# Patient Record
Sex: Female | Born: 1980 | Race: Black or African American | Hispanic: No | Marital: Single | Smoking: Current every day smoker
Health system: Southern US, Community
[De-identification: ages and names within clinical notes are randomized; demographics above are authoritative.]

## PROBLEM LIST (undated history)

## (undated) DIAGNOSIS — T7840XA Allergy, unspecified, initial encounter: Secondary | ICD-10-CM

## (undated) DIAGNOSIS — F419 Anxiety disorder, unspecified: Secondary | ICD-10-CM

## (undated) DIAGNOSIS — I10 Essential (primary) hypertension: Secondary | ICD-10-CM

## (undated) DIAGNOSIS — E119 Type 2 diabetes mellitus without complications: Secondary | ICD-10-CM

## (undated) HISTORY — DX: Anxiety disorder, unspecified: F41.9

## (undated) HISTORY — DX: Essential (primary) hypertension: I10

## (undated) HISTORY — DX: Allergy, unspecified, initial encounter: T78.40XA

## (undated) HISTORY — PX: CHOLECYSTECTOMY: SHX55

## (undated) HISTORY — DX: Type 2 diabetes mellitus without complications: E11.9

## (undated) HISTORY — PX: DENTAL SURGERY: SHX609

## (undated) NOTE — *Deleted (*Deleted)
Diabetes Mellitus and Nutrition, Adult When you have diabetes (diabetes mellitus), it is very important to have healthy eating habits because your blood sugar (glucose) levels are greatly affected by what you eat and drink. Eating healthy foods in the appropriate amounts, at about the same times every day, can help you:  Control your blood glucose.  Lower your risk of heart disease.  Improve your blood pressure.  Reach or maintain a healthy weight. Every person with diabetes is different, and each person has different needs for a meal plan. Your health care provider may recommend that you work with a diet and nutrition specialist (dietitian) to make a meal plan that is best for you. Your meal plan may vary depending on factors such as:  The calories you need.  The medicines you take.  Your weight.  Your blood glucose, blood pressure, and cholesterol levels.  Your activity level.  Other health conditions you have, such as heart or kidney disease. How do carbohydrates affect me? Carbohydrates, also called carbs, affect your blood glucose level more than any other type of food. Eating carbs naturally raises the amount of glucose in your blood. Carb counting is a method for keeping track of how many carbs you eat. Counting carbs is important to keep your blood glucose at a healthy level, especially if you use insulin or take certain oral diabetes medicines. It is important to know how many carbs you can safely have in each meal. This is different for every person. Your dietitian can help you calculate how many carbs you should have at each meal and for each snack. Foods that contain carbs include:  Bread, cereal, rice, pasta, and crackers.  Potatoes and corn.  Peas, beans, and lentils.  Milk and yogurt.  Fruit and juice.  Desserts, such as cakes, cookies, ice cream, and candy. How does alcohol affect me? Alcohol can cause a sudden decrease in blood glucose (hypoglycemia),  especially if you use insulin or take certain oral diabetes medicines. Hypoglycemia can be a life-threatening condition. Symptoms of hypoglycemia (sleepiness, dizziness, and confusion) are similar to symptoms of having too much alcohol. If your health care provider says that alcohol is safe for you, follow these guidelines:  Limit alcohol intake to no more than 1 drink per day for nonpregnant women and 2 drinks per day for men. One drink equals 12 oz of beer, 5 oz of wine, or 1 oz of hard liquor.  Do not drink on an empty stomach.  Keep yourself hydrated with water, diet soda, or unsweetened iced tea.  Keep in mind that regular soda, juice, and other mixers may contain a lot of sugar and must be counted as carbs. What are tips for following this plan?  Reading food labels  Start by checking the serving size on the "Nutrition Facts" label of packaged foods and drinks. The amount of calories, carbs, fats, and other nutrients listed on the label is based on one serving of the item. Many items contain more than one serving per package.  Check the total grams (g) of carbs in one serving. You can calculate the number of servings of carbs in one serving by dividing the total carbs by 15. For example, if a food has 30 g of total carbs, it would be equal to 2 servings of carbs.  Check the number of grams (g) of saturated and trans fats in one serving. Choose foods that have low or no amount of these fats.  Check the number of   milligrams (mg) of salt (sodium) in one serving. Most people should limit total sodium intake to less than 2,300 mg per day.  Always check the nutrition information of foods labeled as "low-fat" or "nonfat". These foods may be higher in added sugar or refined carbs and should be avoided.  Talk to your dietitian to identify your daily goals for nutrients listed on the label. Shopping  Avoid buying canned, premade, or processed foods. These foods tend to be high in fat, sodium,  and added sugar.  Shop around the outside edge of the grocery store. This includes fresh fruits and vegetables, bulk grains, fresh meats, and fresh dairy. Cooking  Use low-heat cooking methods, such as baking, instead of high-heat cooking methods like deep frying.  Cook using healthy oils, such as olive, canola, or sunflower oil.  Avoid cooking with butter, cream, or high-fat meats. Meal planning  Eat meals and snacks regularly, preferably at the same times every day. Avoid going long periods of time without eating.  Eat foods high in fiber, such as fresh fruits, vegetables, beans, and whole grains. Talk to your dietitian about how many servings of carbs you can eat at each meal.  Eat 4-6 ounces (oz) of lean protein each day, such as lean meat, chicken, fish, eggs, or tofu. One oz of lean protein is equal to: ? 1 oz of meat, chicken, or fish. ? 1 egg. ?  cup of tofu.  Eat some foods each day that contain healthy fats, such as avocado, nuts, seeds, and fish. Lifestyle  Check your blood glucose regularly.  Exercise regularly as told by your health care provider. This may include: ? 150 minutes of moderate-intensity or vigorous-intensity exercise each week. This could be brisk walking, biking, or water aerobics. ? Stretching and doing strength exercises, such as yoga or weightlifting, at least 2 times a week.  Take medicines as told by your health care provider.  Do not use any products that contain nicotine or tobacco, such as cigarettes and e-cigarettes. If you need help quitting, ask your health care provider.  Work with a counselor or diabetes educator to identify strategies to manage stress and any emotional and social challenges. Questions to ask a health care provider  Do I need to meet with a diabetes educator?  Do I need to meet with a dietitian?  What number can I call if I have questions?  When are the best times to check my blood glucose? Where to find more  information:  American Diabetes Association: diabetes.org  Academy of Nutrition and Dietetics: www.eatright.org  National Institute of Diabetes and Digestive and Kidney Diseases (NIH): www.niddk.nih.gov Summary  A healthy meal plan will help you control your blood glucose and maintain a healthy lifestyle.  Working with a diet and nutrition specialist (dietitian) can help you make a meal plan that is best for you.  Keep in mind that carbohydrates (carbs) and alcohol have immediate effects on your blood glucose levels. It is important to count carbs and to use alcohol carefully. This information is not intended to replace advice given to you by your health care provider. Make sure you discuss any questions you have with your health care provider. Document Revised: 02/27/2017 Document Reviewed: 04/21/2016 Elsevier Patient Education  2020 Elsevier Inc.  

---

## 2000-02-03 ENCOUNTER — Other Ambulatory Visit: Admission: RE | Admit: 2000-02-03 | Discharge: 2000-02-03 | Payer: Self-pay | Admitting: Family Medicine

## 2003-06-13 ENCOUNTER — Other Ambulatory Visit: Admission: RE | Admit: 2003-06-13 | Discharge: 2003-06-13 | Payer: Self-pay | Admitting: Family Medicine

## 2006-11-07 ENCOUNTER — Ambulatory Visit (HOSPITAL_COMMUNITY): Admission: RE | Admit: 2006-11-07 | Discharge: 2006-11-07 | Payer: Self-pay | Admitting: Orthopedic Surgery

## 2006-11-12 ENCOUNTER — Encounter: Admission: RE | Admit: 2006-11-12 | Discharge: 2007-02-10 | Payer: Self-pay | Admitting: Orthopedic Surgery

## 2008-01-04 ENCOUNTER — Emergency Department (HOSPITAL_COMMUNITY): Admission: EM | Admit: 2008-01-04 | Discharge: 2008-01-04 | Payer: Self-pay | Admitting: Emergency Medicine

## 2009-02-07 IMAGING — CR DG CHEST 2V
2 series · 2 of 2 positions shown · non-contrast
Comparison: 11/05/2006

CLINICAL DATA: Chest pain, headache

CHEST - 2 VIEW

[w chest pa]
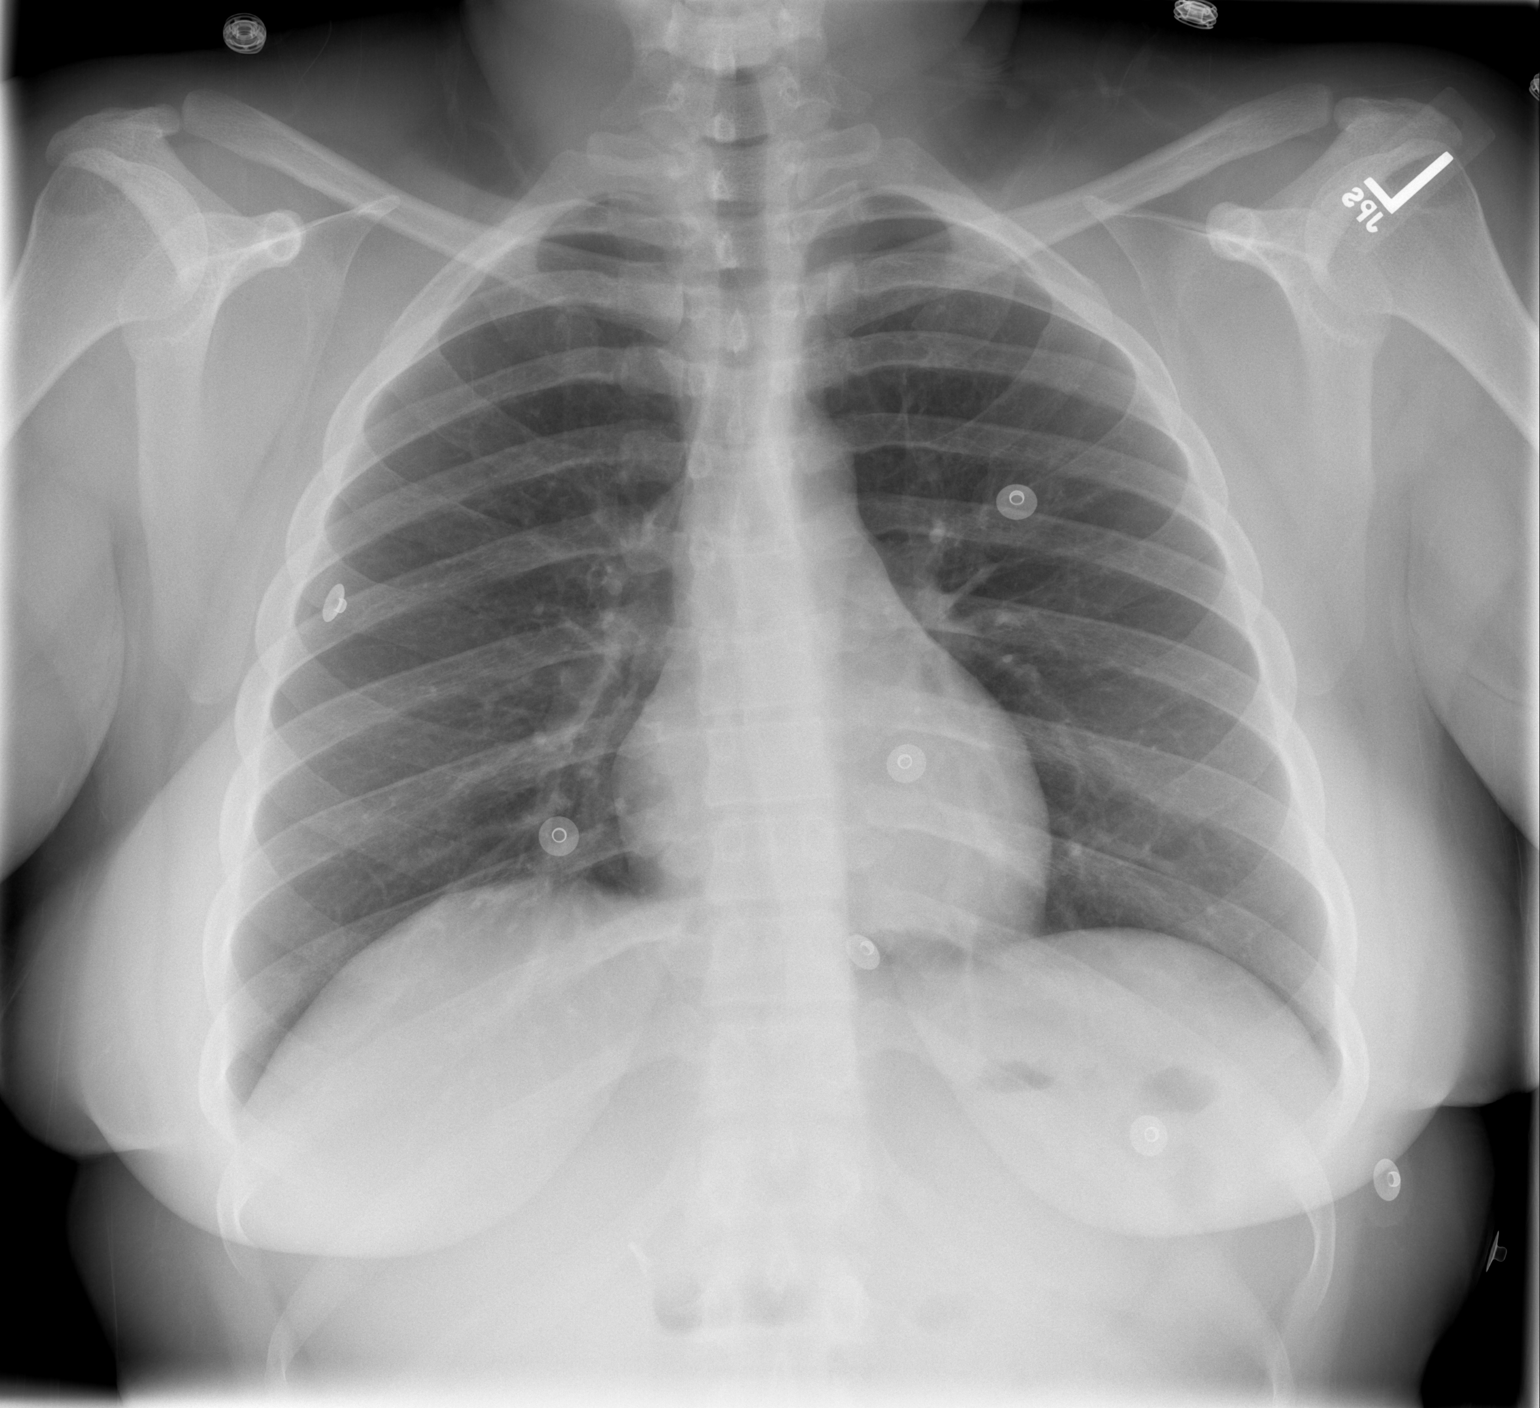

[w chest lat]
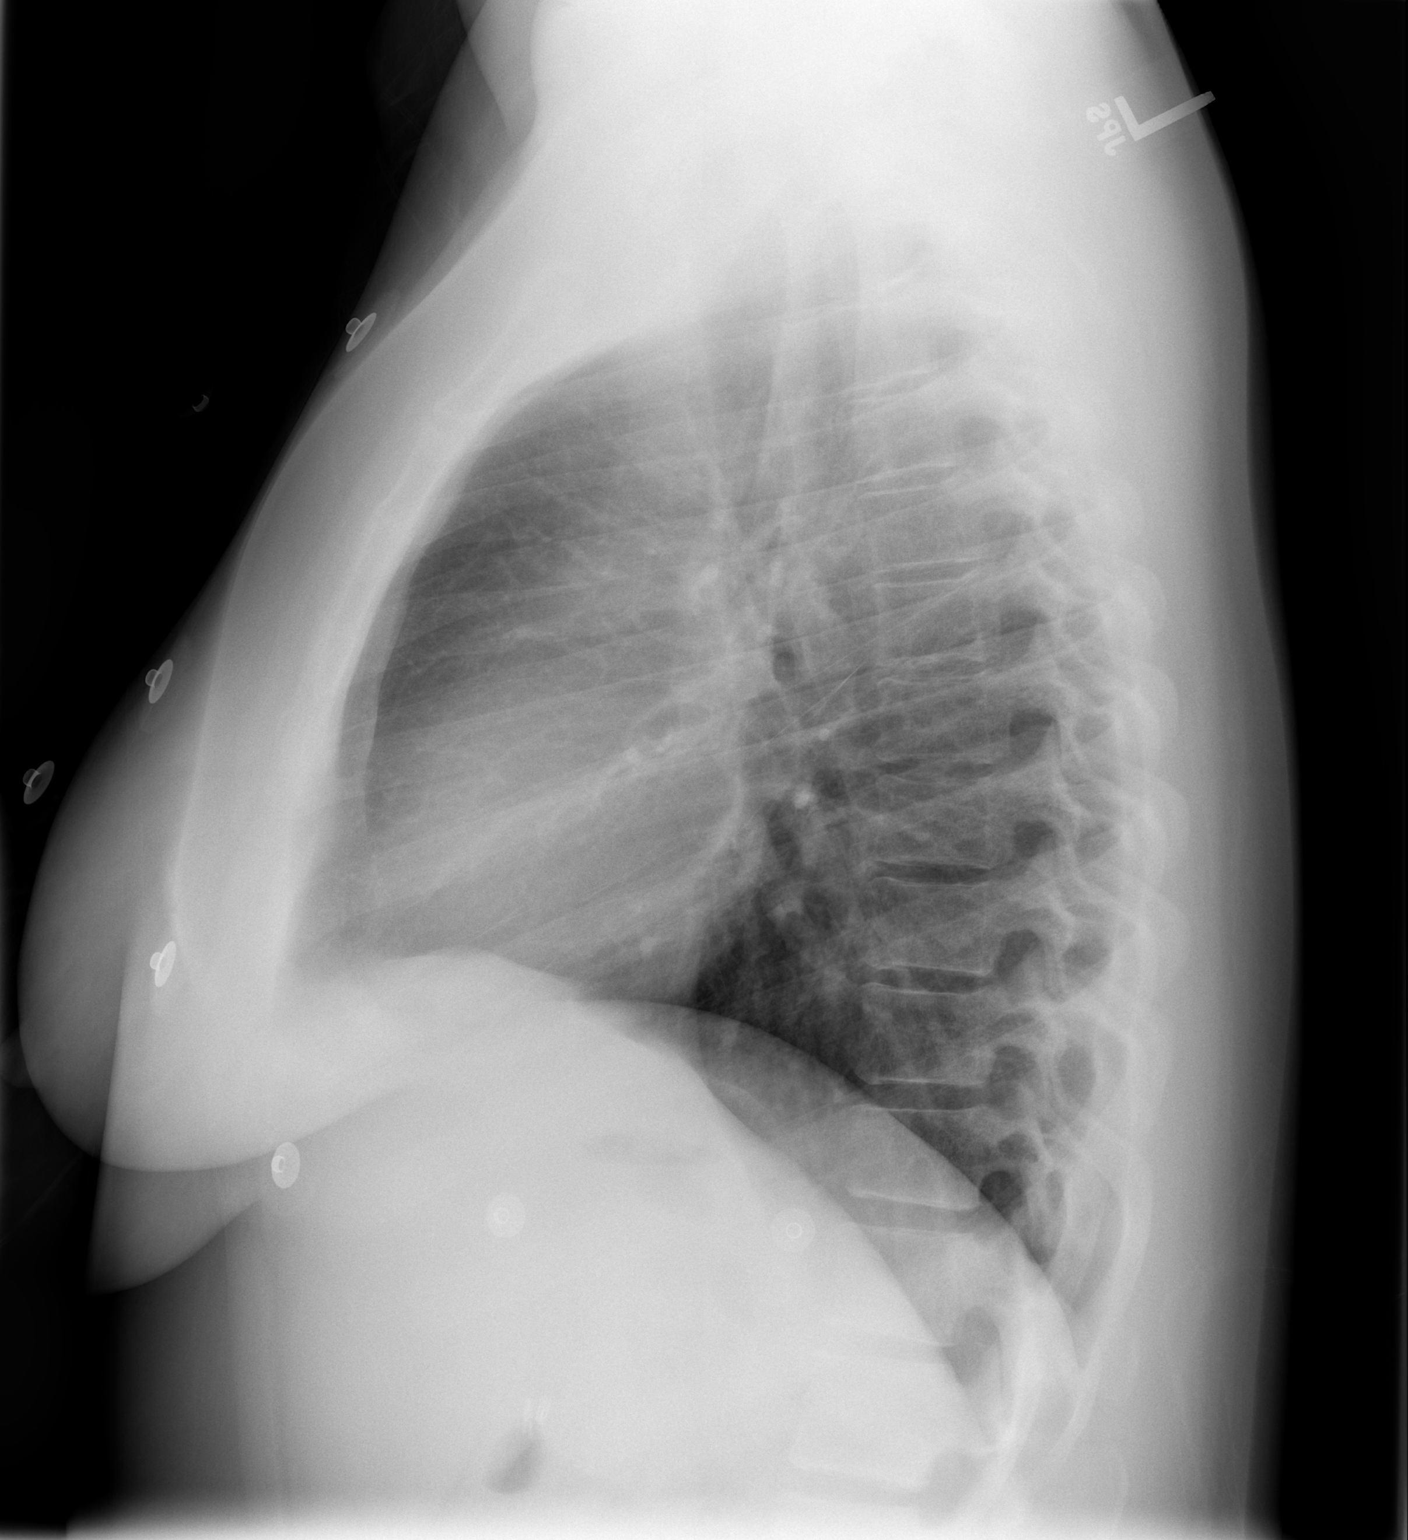

[2 of 2 positions shown; findings below may reference images not displayed]

FINDINGS: The cardiomediastinal silhouette is stable.  No acute
infiltrate or pleural effusion.  No pulmonary edema.  The bony
thorax is stable.
IMPRESSION: No active disease.

## 2010-08-13 NOTE — Op Note (Signed)
[name], Dawn Benjamin          ACCOUNT NO.:  [id number]   MEDICAL RECORD NO.:  [id number]          PATIENT TYPE:  REC   LOCATION:  [facility]                         FACILITY:  [facility]   PHYSICIAN:  Artist Pais. Weingold, M.D.DATE OF BIRTH:  1980/06/01   DATE OF PROCEDURE:  11/07/2006  DATE OF DISCHARGE:                               OPERATIVE REPORT   PREOPERATIVE DIAGNOSIS:  Right fourth and fifth volar laceration.   POSTOPERATIVE DIAGNOSIS:  Right fourth and fifth volar laceration.   PROCEDURE:  1. Explore and repair of right fourth and fifth volar lacerations,      including superficialis and fundus repairs.  2. Zone 2 right fourth and fifth digits.  3. Microscopic repair of right fifth digit ulnar digital nerve.   SURGEON:  Artist Pais. Mina Marble, M.D.   ASSISTANT:  None.   ANESTHESIA:  General.   TOURNIQUET TIME:  1 hour 30 minutes.   COMPLICATIONS:  None.   DRAINS:  None.   OPERATIVE REPORT:  The patient was taken to the operating suite.  After  the induction of adequate general anesthesia, the right upper extremity  was prepped and draped in sterile fashion.  An Esmarch was used to  exsanguinate the limb.  The tourniquet was then inflated to 250 mm of  Mercurty at this point and time.  The patient's lacerations, which had  been sewn shut at the [facility] emergency room, were opened.  They were transverse incisions over the proximal phalangeal area.  They  were opened in mid-lateral fashion proximally and distally.  Visualization revealed complete lacerations of the profundus tendon in  the long finger zone 2,  as well as the ulnar digital nerve.  The ring  finger had a complete laceration of the profundus tendon, and one foot  to the superficialis tendon.  The little finger was approached first  surgically, including repair of profundus and superficialis tendons.  The superficialis tendon slips were repaired with 4-0 Ethibond and  horizontal mattress sutures for  each slip, followed by 6-0 Prolene  epitendinus stitch.  The profundus tendon was repaired with the 3-0  double-armed Ethibond, and a Tajami suture with a 6-0 Prolene locked  epitendinus suture to follow.  After this was done on the little finger,  the microscope was brought into the field and the ulnar digital nerve  was repaired with 9-0 nylon.  The ring finger and the profundus tendon  was repaired using the same method, as well as one slip for the  superficialis  tendon.  At the end of the procedure the wounds were irrigated and  loosely closed with 4-0 nylon.  Xerofoam, 4x4s, fluffs and a dorsal  extension block splint were applied.  The patient tolerated the  procedures well and went to the recovery room in satisfactory condition.      Artist Pais Mina Marble, M.D.  Electronically Signed     MAW/MEDQ  D:  11/07/2006  T:  11/08/2006  Job:  [id number]

## 2010-12-30 LAB — CBC
HCT: 46.1 — ABNORMAL HIGH
Hemoglobin: 15.9 — ABNORMAL HIGH
MCV: 84.5
RBC: 5.46 — ABNORMAL HIGH
WBC: 15.3 — ABNORMAL HIGH

## 2010-12-30 LAB — BASIC METABOLIC PANEL
BUN: 9
CO2: 23
Calcium: 9.9
GFR calc non Af Amer: >60
Glucose, Bld: 280 — ABNORMAL HIGH
Sodium: 136

## 2010-12-30 LAB — DIFFERENTIAL
Basophils Absolute: 0.1
Basophils Relative: 1
Monocytes Absolute: 0.8
Monocytes Relative: 5
Neutro Abs: 9.5 — ABNORMAL HIGH
Neutrophils Relative %: 62

## 2011-01-13 LAB — BASIC METABOLIC PANEL
BUN: 9
CO2: 25
Calcium: 9.7
Chloride: 104
Creatinine, Ser: 0.68
GFR calc Af Amer: >60
GFR calc non Af Amer: >60
Glucose, Bld: 241 — ABNORMAL HIGH
Potassium: 3.5
Sodium: 138

## 2011-01-13 LAB — HCG, SERUM, QUALITATIVE: Preg, Serum: NEGATIVE

## 2011-01-13 LAB — CBC
HCT: 42.8
Hemoglobin: 14.7
MCHC: 34.3
MCV: 86.5
Platelets: 369
RBC: 4.95
RDW: 12.3
WBC: 10.9 — ABNORMAL HIGH

## 2013-12-13 ENCOUNTER — Telehealth: Payer: Self-pay | Admitting: Family Medicine

## 2013-12-22 NOTE — Telephone Encounter (Signed)
Several attempts made to patient and no answer

## 2013-12-27 ENCOUNTER — Telehealth: Payer: Self-pay | Admitting: Family Medicine

## 2013-12-27 NOTE — Telephone Encounter (Signed)
Appt given per patient request 

## 2013-12-30 ENCOUNTER — Ambulatory Visit (INDEPENDENT_AMBULATORY_CARE_PROVIDER_SITE_OTHER): Payer: PRIVATE HEALTH INSURANCE | Admitting: Family

## 2013-12-30 ENCOUNTER — Encounter (INDEPENDENT_AMBULATORY_CARE_PROVIDER_SITE_OTHER): Payer: Self-pay

## 2013-12-30 ENCOUNTER — Encounter: Payer: Self-pay | Admitting: Family

## 2013-12-30 VITALS — BP 136/91 | HR 105 | Temp 99.5°F | Wt 155.2 lb

## 2013-12-30 DIAGNOSIS — L0291 Cutaneous abscess, unspecified: Secondary | ICD-10-CM

## 2013-12-30 MED ORDER — CEPHALEXIN 500 MG PO CAPS: 500.0000 mg | ORAL_CAPSULE | Freq: Four times a day (QID) | ORAL | Status: DC

## 2013-12-30 NOTE — Progress Notes (Signed)
   Subjective:    Patient ID: Dawn Benjamin, female    DOB: 05/05/1980, 33 y.o.   MRN: [id number]  HPI Pt presents to the office for several boils on upper thighs and labia. Pt states the one on her labia is large and painful, and the 3-4 that were on her thigh "busted" a few days ago. Pt states it started about one week ago and has used warm compresses which helped. Pt states she she has had these in the past that flare up over the last couple of years.    Review of Systems  Constitutional: Negative.   HENT: Negative.   Eyes: Negative.   Respiratory: Negative.  Negative for shortness of breath.   Cardiovascular: Negative.  Negative for palpitations.  Gastrointestinal: Negative.   Endocrine: Negative.   Genitourinary: Negative.   Musculoskeletal: Negative.   Neurological: Negative.  Negative for headaches.  Hematological: Negative.   Psychiatric/Behavioral: Negative.   All other systems reviewed and are negative.      Objective:   Physical Exam  Vitals reviewed. Constitutional: She is oriented to person, place, and time. She appears well-developed and well-nourished. No distress.  Eyes: Pupils are equal, round, and reactive to light.  Neck: Normal range of motion. Neck supple. No thyromegaly present.  Cardiovascular: Normal rate, regular rhythm, normal heart sounds and intact distal pulses.   No murmur heard. Pulmonary/Chest: Effort normal and breath sounds normal. No respiratory distress. She has no wheezes.  Abdominal: Soft. Bowel sounds are normal. She exhibits no distension. There is no tenderness.  Musculoskeletal: Normal range of motion. She exhibits no edema and no tenderness.  Neurological: She is alert and oriented to person, place, and time. She has normal reflexes. No cranial nerve deficit.  Skin: Skin is warm and dry. There is erythema.  Three erythemas hard abscess on outer labia. Two healing abscess on inner thigh  Psychiatric: She has a normal mood and  affect. Her behavior is normal. Judgment and thought content normal.    BP 136/91  Pulse 105  Temp(Src) 99.5 F (37.5 C) (Oral)  Wt 155 lb 3.2 oz (70.398 kg)  LMP 12/26/2013       Assessment & Plan:  1. Abscess -Keep area clean and dry -Warm compresses -Sitz baths - cephALEXin (KEFLEX) 500 MG capsule; Take 1 capsule (500 mg total) by mouth 4 (four) times daily.  Dispense: 28 capsule; Refill: 1 - Ambulatory referral to Dermatology  Jannifer Rodneyhristy Hawks, FNP

## 2013-12-30 NOTE — Patient Instructions (Signed)

## 2014-02-07 ENCOUNTER — Encounter: Payer: Self-pay | Admitting: Family Medicine

## 2014-02-07 ENCOUNTER — Ambulatory Visit (INDEPENDENT_AMBULATORY_CARE_PROVIDER_SITE_OTHER): Payer: PRIVATE HEALTH INSURANCE | Admitting: Family Medicine

## 2014-02-07 VITALS — BP 156/95 | HR 96 | Temp 98.6°F | Ht 64.0 in | Wt 163.4 lb

## 2014-02-07 DIAGNOSIS — L0292 Furuncle, unspecified: Secondary | ICD-10-CM

## 2014-02-07 MED ORDER — DOXYCYCLINE HYCLATE 100 MG PO TABS: 100.0000 mg | ORAL_TABLET | Freq: Two times a day (BID) | ORAL | Status: DC

## 2014-02-07 NOTE — Progress Notes (Signed)
   Subjective:    Patient ID: Dawn Benjamin, female    DOB: 02/01/1981, 33 y.o.   MRN: [id number]  HPI C/o inner thigh boils.  She has an appointment to see Derm 11/30.  She has taken keflex in past.  Review of Systems  Constitutional: Negative for fever.  HENT: Negative for ear pain.   Eyes: Negative for discharge.  Respiratory: Negative for cough.   Cardiovascular: Negative for chest pain.  Gastrointestinal: Negative for abdominal distention.  Endocrine: Negative for polyuria.  Genitourinary: Negative for difficulty urinating.  Musculoskeletal: Negative for gait problem and neck pain.  Skin: Negative for color change and rash.  Neurological: Negative for speech difficulty and headaches.  Psychiatric/Behavioral: Negative for agitation.       Objective:    BP 156/95 mmHg  Pulse 96  Temp(Src) 98.6 F (37 C) (Oral)  Ht 5\' 4"  (1.626 m)  Wt 163 lb 6.4 oz (74.118 kg)  BMI 28.03 kg/m2  LMP 01/17/2014 Physical Exam  Constitutional: She is oriented to person, place, and time. She appears well-developed and well-nourished.  HENT:  Head: Normocephalic and atraumatic.  Mouth/Throat: Oropharynx is clear and moist.  Eyes: Pupils are equal, round, and reactive to light.  Neck: Normal range of motion. Neck supple.  Cardiovascular: Normal rate and regular rhythm.   No murmur heard. Pulmonary/Chest: Effort normal and breath sounds normal.  Abdominal: Soft. Bowel sounds are normal. There is no tenderness.  Neurological: She is alert and oriented to person, place, and time.  Skin: Skin is warm and dry.  Psychiatric: She has a normal mood and affect.          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  Deatra CanterWilliam J Eisen Robenson FNP

## 2015-09-23 ENCOUNTER — Emergency Department (HOSPITAL_COMMUNITY)
Admission: EM | Admit: 2015-09-23 | Discharge: 2015-09-23 | Disposition: A | Discharge status: Home / Self Care | Payer: Self-pay | Attending: Emergency Medicine | Admitting: Emergency Medicine

## 2015-09-23 ENCOUNTER — Encounter (HOSPITAL_COMMUNITY): Payer: Self-pay | Admitting: Emergency Medicine

## 2015-09-23 DIAGNOSIS — F17200 Nicotine dependence, unspecified, uncomplicated: Secondary | ICD-10-CM | POA: Insufficient documentation

## 2015-09-23 DIAGNOSIS — E119 Type 2 diabetes mellitus without complications: Secondary | ICD-10-CM | POA: Insufficient documentation

## 2015-09-23 DIAGNOSIS — I10 Essential (primary) hypertension: Secondary | ICD-10-CM | POA: Insufficient documentation

## 2015-09-23 DIAGNOSIS — J029 Acute pharyngitis, unspecified: Secondary | ICD-10-CM | POA: Insufficient documentation

## 2015-09-23 MED ORDER — AMOXICILLIN 500 MG PO CAPS: 500.0000 mg | ORAL_CAPSULE | Freq: Three times a day (TID) | ORAL | Status: DC

## 2015-09-23 NOTE — Discharge Instructions (Signed)

## 2015-09-23 NOTE — ED Notes (Signed)
PT c/o sore throat x3 days. PT states she was [facility] and had a negative strep test and was diagnosed with pharyngitis with no prescriptions and throat pain hasn't gotten any better since.

## 2015-09-23 NOTE — ED Provider Notes (Signed)
CSN: [id number]     Arrival date & time 09/23/15  0806 History   First MD Initiated Contact with Patient 09/23/15 [phone]     Chief Complaint  Patient presents with  . Sore Throat     (Consider location/radiation/quality/duration/timing/severity/associated sxs/prior Treatment) Patient is a 35 y.o. female presenting with pharyngitis. The history is provided by the patient. No language interpreter was used.  Sore Throat This is a new problem. The current episode started in the past 7 days. The problem occurs constantly. The problem has been gradually worsening. Associated symptoms include a fever and a sore throat. Nothing aggravates the symptoms. She has tried nothing for the symptoms. The treatment provided moderate relief.  Pt complains of a sore throat.  Pt has pain in her left ear.    Past Medical History  Diagnosis Date  . Diabetes mellitus without complication (HCC)   . Hypertension    Past Surgical History  Procedure Laterality Date  . Cholecystectomy    . Dental surgery     History reviewed. No pertinent family history. Social History  Substance Use Topics  . Smoking status: Current Every Day Smoker -- 1.00 packs/day  . Smokeless tobacco: None  . Alcohol Use: Yes   OB History    Gravida Para Term Preterm AB TAB SAB Ectopic Multiple Living   1         1     Review of Systems  Constitutional: Positive for fever.  HENT: Positive for sore throat.   All other systems reviewed and are negative.     Allergies  Review of patient's allergies indicates no known allergies.  Home Medications   Prior to Admission medications   Medication Sig Start Date End Date Taking? Authorizing Provider  amoxicillin (AMOXIL) 500 MG capsule Take 1 capsule (500 mg total) by mouth 3 (three) times daily. 09/23/15   Elson AreasLeslie K Saiquan Hands, PA-C  doxycycline (VIBRA-TABS) 100 MG tablet Take 1 tablet (100 mg total) by mouth 2 (two) times daily. 02/07/14   Deatra CanterWilliam J Oxford, FNP   lisinopril-hydrochlorothiazide (PRINZIDE,ZESTORETIC) 20-12.5 MG per tablet Take 1 tablet by mouth daily.    Historical Provider, MD  metFORMIN (GLUCOPHAGE) 1000 MG tablet Take 1,000 mg by mouth 2 (two) times daily with a meal.    Historical Provider, MD   BP 129/95 mmHg  Pulse 88  Temp(Src) 97.9 F (36.6 C) (Oral)  Resp 16  Ht 5\' 3"  (1.6 m)  Wt 74.844 kg  BMI 29.24 kg/m2  SpO2 100%  LMP 08/24/2015 Physical Exam  Constitutional: She is oriented to person, place, and time. She appears well-developed and well-nourished.  HENT:  Head: Normocephalic.  Right Ear: External ear normal.  Left Ear: External ear normal.  Mouth/Throat: Oropharynx is clear and moist.  Eyes: Pupils are equal, round, and reactive to light.  Neck: Normal range of motion.  Cardiovascular: Normal rate.   Pulmonary/Chest: Effort normal.  Abdominal: Soft.  Musculoskeletal: Normal range of motion.  Neurological: She is alert and oriented to person, place, and time. She has normal reflexes.  Skin: Skin is warm.  Psychiatric: She has a normal mood and affect.  Nursing note and vitals reviewed.   ED Course  Procedures (including critical care time) Labs Review Labs Reviewed - No data to display  Imaging Review No results found. I have personally reviewed and evaluated these images and lab results as part of my medical decision-making.   EKG Interpretation None      MDM   Final diagnoses:  Pharyngitis    An After Visit Summary was printed and given to the patient. Meds ordered this encounter  Medications  . amoxicillin (AMOXIL) 500 MG capsule    Sig: Take 1 capsule (500 mg total) by mouth 3 (three) times daily.    Dispense:  30 capsule    Refill:  0    Order Specific Question:  Supervising Provider    Answer:  Eber HongMILLER, BRIAN [[id number]]      Lonia SkinnerLeslie K [place], PA-C 09/23/15 [phone]  Loren Raceravid Yelverton, MD 09/28/15 [id number]

## 2019-03-09 ENCOUNTER — Ambulatory Visit (INDEPENDENT_AMBULATORY_CARE_PROVIDER_SITE_OTHER): Payer: Managed Care, Other (non HMO) | Admitting: Family Medicine

## 2019-03-09 ENCOUNTER — Encounter: Payer: Self-pay | Admitting: Family Medicine

## 2019-03-09 ENCOUNTER — Other Ambulatory Visit: Payer: Self-pay

## 2019-03-09 VITALS — BP 138/80 | HR 91 | Temp 98.7°F | Resp 15 | Ht 63.0 in | Wt 151.1 lb

## 2019-03-09 DIAGNOSIS — I10 Essential (primary) hypertension: Secondary | ICD-10-CM | POA: Diagnosis not present

## 2019-03-09 DIAGNOSIS — Z8632 Personal history of gestational diabetes: Secondary | ICD-10-CM

## 2019-03-09 DIAGNOSIS — F419 Anxiety disorder, unspecified: Secondary | ICD-10-CM

## 2019-03-09 DIAGNOSIS — Z124 Encounter for screening for malignant neoplasm of cervix: Secondary | ICD-10-CM | POA: Diagnosis not present

## 2019-03-09 DIAGNOSIS — R002 Palpitations: Secondary | ICD-10-CM

## 2019-03-09 DIAGNOSIS — F17210 Nicotine dependence, cigarettes, uncomplicated: Secondary | ICD-10-CM

## 2019-03-09 DIAGNOSIS — E559 Vitamin D deficiency, unspecified: Secondary | ICD-10-CM

## 2019-03-09 NOTE — Progress Notes (Signed)
Subjective:     Patient ID: Dawn Benjamin, female   DOB: 1980-05-16, 38 y.o.   MRN: [id number]  Dawn Benjamin presents for New Patient (Initial Visit) (establish care. went to UC last week and bp was elevated. was started on medication)  Dawn Benjamin is a 38 year old female who presents today to establish care.  Recently have been seen at [facility] urgent care an [facility].  She presented there having elevation of blood pressure and a headache.  She was on antihypertensive med about 2 years ago but reports stopping them.  She has been taking her blood pressure at home and the ranges have been systolic 782 diastolic 956 range and she was sent home from work because her blood pressure was so high at work when the nurses took the BP.  She has a significant history of diabetes without complication which she reports is related to gestational diabetes and high blood pressure.  She is a current everyday smoker but is trying to wean herself down.  Urgent care prescribed her Catapres to take if her systolic was greater than 170, lisinopril hydrochlorothiazide combo.  She reports taking these as directed.  Has only had to use the Catapres a few times.  Blood pressure has responded very well at home.  She says she usually sees the 130s/80's-90's.  She reports improvement in headache and vision changes.  Reports that her diet is not been the best but since going to the urgent care she is really tried to look at modifications that she needs.   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   She declines having any other issues or concerns today to discuss.  Today patient denies signs and symptoms of COVID 19 infection including fever, chills, cough, shortness of breath, and headache.  Past Medical, Surgical, Social History, Allergies, and Medications have been  Reviewed. Past Medical History:  Diagnosis Date  . Diabetes mellitus without complication ([place])   . Hypertension    Past Surgical History:  Procedure Laterality Date  . CHOLECYSTECTOMY    . DENTAL SURGERY     Social History   Socioeconomic History  . Marital status: Single    Spouse name: Not on file  . Number of children: Not on file  . Years of education: Not on file  . Highest education level: Not on file  Occupational History  . Not on file  Social Needs  . Financial resource strain: Not on file  . Food insecurity    Worry: Not on file    Inability: Not on file  . Transportation needs    Medical: Not on file    Non-medical: Not on file  Tobacco Use  . Smoking status: Current Every Day Smoker    Packs/day: 1.00  . Smokeless tobacco: Never Used  Substance and Sexual Activity  . Alcohol use: Yes  . Drug use: No  . Sexual activity: Yes  Lifestyle  . Physical activity    Days per week: Not on file    Minutes per session: Not on file  . Stress: Not on file  Relationships  . Social Herbalist on phone: Not on file    Gets together: Not on file    Attends religious service: Not on file    Active member of club or organization: Not on file  Attends meetings of clubs or organizations: Not on file    Relationship status: Not on file  . Intimate partner violence    Fear of current or ex partner: Not on file    Emotionally abused: Not on file    Physically abused: Not on file    Forced sexual activity: Not on file  Other Topics Concern  . Not on file  Social History Narrative  . Not on file    Outpatient Encounter Medications as of 03/09/2019  Medication Sig  . cloNIDine (CATAPRES) 0.1 MG tablet Take 1 tablet by mouth 2 (two) times daily as needed. Systolic bp greater than 170  . lisinopril-hydrochlorothiazide (PRINZIDE,ZESTORETIC) 20-12.5 MG per tablet Take 1 tablet by mouth daily.  . [DISCONTINUED] amoxicillin (AMOXIL) 500 MG capsule Take 1 capsule  (500 mg total) by mouth 3 (three) times daily. (Patient not taking: Reported on 03/09/2019)  . [DISCONTINUED] doxycycline (VIBRA-TABS) 100 MG tablet Take 1 tablet (100 mg total) by mouth 2 (two) times daily. (Patient not taking: Reported on 03/09/2019)  . [DISCONTINUED] metFORMIN (GLUCOPHAGE) 1000 MG tablet Take 1,000 mg by mouth 2 (two) times daily with a meal.   No facility-administered encounter medications on file as of 03/09/2019.    No Known Allergies  Review of Systems  Constitutional: Negative.  Negative for chills and fever.  HENT: Negative.   Eyes: Negative.   Respiratory: Negative.  Negative for cough and shortness of breath.   Cardiovascular: Negative.   Gastrointestinal: Negative.   Endocrine: Negative.   Genitourinary: Negative.   Musculoskeletal: Negative.   Skin: Negative.   Allergic/Immunologic: Negative.   Neurological: Negative.   Hematological: Negative.   Psychiatric/Behavioral: Negative.   All other systems reviewed and are negative.      Objective:     BP 138/80   Pulse 91   Temp 98.7 F (37.1 C) (Oral)   Resp 15   Ht 5\' 3"  (1.6 m)   Wt 151 lb 1.3 oz (68.5 kg)   SpO2 98%   BMI 26.76 kg/m   Physical Exam Vitals signs and nursing note reviewed.  Constitutional:      Appearance: Normal appearance. She is well-developed, well-groomed and overweight.  HENT:     Head: Normocephalic and atraumatic.     Right Ear: External ear normal.     Left Ear: External ear normal.     Nose: Nose normal.     Mouth/Throat:     Mouth: Mucous membranes are moist.     Pharynx: Oropharynx is clear.  Eyes:     General:        Right eye: No discharge.        Left eye: No discharge.     Conjunctiva/sclera: Conjunctivae normal.  Neck:     Musculoskeletal: Normal range of motion and neck supple.  Cardiovascular:     Rate and Rhythm: Normal rate and regular rhythm.     Pulses: Normal pulses.     Heart sounds: Normal heart sounds.  Pulmonary:     Effort: Pulmonary  effort is normal.     Breath sounds: Normal breath sounds.  Musculoskeletal: Normal range of motion.  Skin:    General: Skin is warm.  Neurological:     General: No focal deficit present.     Mental Status: She is alert and oriented to person, place, and time.  Psychiatric:        Attention and Perception: Attention and perception normal.        Mood  and Affect: Mood and affect normal.        Speech: Speech normal.        Behavior: Behavior normal. Behavior is cooperative.        Thought Content: Thought content normal.        Cognition and Memory: Cognition and memory normal.        Judgment: Judgment normal.        Assessment and Plan        1. Essential hypertension Dawn Benjamin is encouraged to maintain a well balanced diet that is low in salt. Controlled, continue current medication regimen. No refills needed.  Additionally, she is encouraged to start working out, and educated on Exercise and its benefit for heart health and control of  Blood pressure. 30-60 minutes daily is recommended-walking was suggested.  - CBC - COMPLETE METABOLIC PANEL WITH GFR - Lipid panel - TSH  2. History of gestational diabetes mellitus History of diabetes she reports gestational, will be getting updated labs to make sure she does not need to have treatment for this.  - Hemoglobin A1c - Lipid panel  3. Cigarette nicotine dependence without complication Asked about quitting: confirms they are currently smokes cigarettes Advise to quit smoking: Educated about QUITTING to reduce the risk of cancer, cardio and cerebrovascular disease. Assess willingness: Unwilling to quit at this time, but is working on cutting back. Assist with counseling and pharmacotherapy: Counseled for 5 minutes and literature provided. Arrange for follow up:  not quitting follow up in 3 months and continue to offer help.    4. Encounter for screening for malignant neoplasm of cervix Needs to have updated  Pap smear.  Referral to [facility] in [place] provided.  Appreciate collaboration in her care please let PCP know if there is anything that we can do on our end.  - Ambulatory referral to Obstetrics / Gynecology  5. Vitamin D deficiency Reports history of vitamin D deficiency.  Will be checking with labs for updated level and need for possible supplementation.  - VITAMIN D 25 Hydroxy  6. Anxiety Reports having anxiety and palpitations will check thyroid and labs to rule out a cause.   - TSH  7. Palpitation Reports palpitations and anxiety will check labs and thyroid level to make sure that this is not a possible cause.  Otherwise encouraged to make sure she is drinking enough water and eating a well-balanced diet.  - CBC - COMPLETE METABOLIC PANEL WITH GFR - TSH   Follow Up:  4 weeks in office for BP check   This clinic note was created using Scientist, clinical (histocompatibility and immunogenetics). Therefore, there may be occasional mistakes despite careful proofreading.  Freddy Finner, DNP, AGNP-BC [facility] [address], [address] [place], [place] [address] Office Hours: Mon-Thurs 8 am-5 pm; Fri 8 am-12 pm Office Phone:  [phone]  Office Fax: [phone]

## 2019-03-09 NOTE — Patient Instructions (Signed)
  I appreciate the opportunity to provide you with care for your health and wellness. Today we discussed: establish care  Follow up: 4 weeks in office for BP check  Labs today  Continue to work on smoking one less cigarette a day. Good start to diet changes, please keep this up! If you can start walking on 5 days of the week, for 20-30 minutes that will be great for your heart!  I hope you have a wonderful, happy, safe, and healthy Holiday Season! See you in the New Year :)  Please continue to practice social distancing to keep you, your family, and our community safe.  If you must go out, please wear a mask and practice good handwashing.  It was a pleasure to see you and I look forward to continuing to work together on your health and well-being. Please do not hesitate to call the office if you need care or have questions about your care.  Have a wonderful day and week. With Gratitude, Cherly Beach, DNP, AGNP-BC

## 2019-03-10 ENCOUNTER — Other Ambulatory Visit: Payer: Self-pay | Admitting: Family Medicine

## 2019-03-10 ENCOUNTER — Telehealth: Payer: Self-pay | Admitting: Family Medicine

## 2019-03-10 DIAGNOSIS — E559 Vitamin D deficiency, unspecified: Secondary | ICD-10-CM

## 2019-03-10 DIAGNOSIS — E1165 Type 2 diabetes mellitus with hyperglycemia: Secondary | ICD-10-CM

## 2019-03-10 DIAGNOSIS — I10 Essential (primary) hypertension: Secondary | ICD-10-CM

## 2019-03-10 DIAGNOSIS — D509 Iron deficiency anemia, unspecified: Secondary | ICD-10-CM

## 2019-03-10 DIAGNOSIS — E1169 Type 2 diabetes mellitus with other specified complication: Secondary | ICD-10-CM

## 2019-03-10 DIAGNOSIS — E782 Mixed hyperlipidemia: Secondary | ICD-10-CM

## 2019-03-10 DIAGNOSIS — E785 Hyperlipidemia, unspecified: Secondary | ICD-10-CM

## 2019-03-10 DIAGNOSIS — E1159 Type 2 diabetes mellitus with other circulatory complications: Secondary | ICD-10-CM

## 2019-03-10 LAB — LIPID PANEL
Cholesterol: 192 mg/dL (ref <200)
HDL: 43 mg/dL — ABNORMAL LOW (ref >=50)
LDL Cholesterol (Calc): 131 mg/dL (calc) — ABNORMAL HIGH
Non-HDL Cholesterol (Calc): 149 mg/dL (calc) — ABNORMAL HIGH (ref <130)
Total CHOL/HDL Ratio: 4.5 (calc) (ref <5.0)
Triglycerides: 81 mg/dL (ref <150)

## 2019-03-10 LAB — COMPLETE METABOLIC PANEL WITH GFR
AG Ratio: 1.2 (calc) (ref 1.0–2.5)
ALT: 7 U/L (ref 6–29)
AST: 9 U/L — ABNORMAL LOW (ref 10–30)
Albumin: 4 g/dL (ref 3.6–5.1)
Alkaline phosphatase (APISO): 180 U/L — ABNORMAL HIGH (ref 31–125)
BUN: 17 mg/dL (ref 7–25)
CO2: 26 mmol/L (ref 20–32)
Calcium: 9.9 mg/dL (ref 8.6–10.2)
Chloride: 97 mmol/L — ABNORMAL LOW (ref 98–110)
Creat: 0.83 mg/dL (ref 0.50–1.10)
GFR, Est African American: 103 mL/min/{1.73_m2} (ref >=60)
GFR, Est Non African American: 89 mL/min/{1.73_m2} (ref >=60)
Globulin: 3.4 g/dL (calc) (ref 1.9–3.7)
Glucose, Bld: 414 mg/dL — ABNORMAL HIGH (ref 65–99)
Potassium: 4 mmol/L (ref 3.5–5.3)
Sodium: 133 mmol/L — ABNORMAL LOW (ref 135–146)
Total Bilirubin: 0.4 mg/dL (ref 0.2–1.2)
Total Protein: 7.4 g/dL (ref 6.1–8.1)

## 2019-03-10 LAB — CBC
HCT: 35.2 % (ref 35.0–45.0)
Hemoglobin: 10.8 g/dL — ABNORMAL LOW (ref 11.7–15.5)
MCH: 22 pg — ABNORMAL LOW (ref 27.0–33.0)
MCHC: 30.7 g/dL — ABNORMAL LOW (ref 32.0–36.0)
MCV: 71.8 fL — ABNORMAL LOW (ref 80.0–100.0)
MPV: 10.2 fL (ref 7.5–12.5)
Platelets: 365 10*3/uL (ref 140–400)
RBC: 4.9 10*6/uL (ref 3.80–5.10)
RDW: 14.9 % (ref 11.0–15.0)
WBC: 9.6 10*3/uL (ref 3.8–10.8)

## 2019-03-10 LAB — VITAMIN D 25 HYDROXY (VIT D DEFICIENCY, FRACTURES): Vit D, 25-Hydroxy: 7 ng/mL — ABNORMAL LOW (ref 30–100)

## 2019-03-10 LAB — HEMOGLOBIN A1C
Hgb A1c MFr Bld: 12.9 % of total Hgb — ABNORMAL HIGH (ref <5.7)
Mean Plasma Glucose: 324 (calc)
eAG (mmol/L): 17.9 (calc)

## 2019-03-10 LAB — TSH: TSH: 1.65 mIU/L

## 2019-03-10 MED ORDER — IRON 325 (65 FE) MG PO TABS: 325.0000 mg | ORAL_TABLET | Freq: Two times a day (BID) | ORAL | 3 refills | Status: DC

## 2019-03-10 MED ORDER — GLIPIZIDE ER 10 MG PO TB24: 10.0000 mg | ORAL_TABLET | Freq: Every day | ORAL | 0 refills | Status: DC

## 2019-03-10 MED ORDER — LISINOPRIL 10 MG PO TABS: 10.0000 mg | ORAL_TABLET | Freq: Every day | ORAL | 3 refills | Status: DC

## 2019-03-10 MED ORDER — ROSUVASTATIN CALCIUM 5 MG PO TABS: 5.0000 mg | ORAL_TABLET | Freq: Every day | ORAL | 3 refills | Status: DC

## 2019-03-10 MED ORDER — VITAMIN D (ERGOCALCIFEROL) 1.25 MG (50000 UNIT) PO CAPS: 50000.0000 [IU] | ORAL_CAPSULE | ORAL | 3 refills | Status: DC

## 2019-03-10 MED ORDER — METFORMIN HCL 500 MG PO TABS: 500.0000 mg | ORAL_TABLET | Freq: Two times a day (BID) | ORAL | 0 refills | Status: DC

## 2019-03-10 NOTE — Telephone Encounter (Signed)
States blood sugar 400 and was expecting insulin to be called in. Reviewed chart. I am prescribing glipizide and metformin, 1 month only.  I note that pt  has been referred by you to Endo

## 2019-03-11 ENCOUNTER — Other Ambulatory Visit: Payer: Self-pay | Admitting: Family Medicine

## 2019-03-21 ENCOUNTER — Encounter (INDEPENDENT_AMBULATORY_CARE_PROVIDER_SITE_OTHER): Payer: Self-pay

## 2019-03-21 ENCOUNTER — Encounter (INDEPENDENT_AMBULATORY_CARE_PROVIDER_SITE_OTHER): Payer: Managed Care, Other (non HMO) | Admitting: Ophthalmology

## 2019-04-05 ENCOUNTER — Ambulatory Visit: Payer: Managed Care, Other (non HMO) | Admitting: Family Medicine

## 2019-04-06 ENCOUNTER — Ambulatory Visit: Payer: Managed Care, Other (non HMO) | Admitting: Family Medicine

## 2019-04-07 ENCOUNTER — Ambulatory Visit (INDEPENDENT_AMBULATORY_CARE_PROVIDER_SITE_OTHER): Payer: Managed Care, Other (non HMO) | Admitting: Family Medicine

## 2019-04-07 ENCOUNTER — Other Ambulatory Visit: Payer: Self-pay

## 2019-04-07 ENCOUNTER — Encounter: Payer: Self-pay | Admitting: Family Medicine

## 2019-04-07 VITALS — BP 168/88 | HR 86 | Temp 98.5°F | Resp 15 | Ht 63.0 in | Wt 160.0 lb

## 2019-04-07 DIAGNOSIS — I10 Essential (primary) hypertension: Secondary | ICD-10-CM | POA: Diagnosis not present

## 2019-04-07 DIAGNOSIS — F17210 Nicotine dependence, cigarettes, uncomplicated: Secondary | ICD-10-CM

## 2019-04-07 DIAGNOSIS — E1165 Type 2 diabetes mellitus with hyperglycemia: Secondary | ICD-10-CM | POA: Insufficient documentation

## 2019-04-07 MED ORDER — CLONIDINE HCL 0.1 MG PO TABS: 0.1000 mg | ORAL_TABLET | Freq: Every day | ORAL | 1 refills | Status: DC

## 2019-04-07 MED ORDER — LISINOPRIL-HYDROCHLOROTHIAZIDE 20-25 MG PO TABS: 1.0000 | ORAL_TABLET | Freq: Every day | ORAL | 1 refills | Status: DC

## 2019-04-07 MED ORDER — GLIPIZIDE ER 10 MG PO TB24: 10.0000 mg | ORAL_TABLET | Freq: Every day | ORAL | 1 refills | Status: DC

## 2019-04-07 MED ORDER — METFORMIN HCL 500 MG PO TABS: 500.0000 mg | ORAL_TABLET | Freq: Two times a day (BID) | ORAL | 1 refills | Status: DC

## 2019-04-07 NOTE — Assessment & Plan Note (Signed)
Dawn Benjamin is encouraged to maintain a well balanced diet that is low in salt. Still not well controlled, increase lisinopril hydrochlorothiazide combo to 2025 and started Catapres once daily.  Close follow-up in 1 month advised to call if she has any signs or symptoms or does not feel she is doing well with the additional steady medication.  Encouraged to check her blood pressure will do phone visit unless symptomatic. Additionally, she is also reminded that exercise is beneficial for heart health and control of  Blood pressure. 30-60 minutes daily is recommended-walking was suggested.   Reviewed side effects, risks and benefits of medication.   Patient acknowledged agreement and understanding of the plan.

## 2019-04-07 NOTE — Patient Instructions (Addendum)
Happy New Year! May you have a year filled with hope, love, happiness and laughter.  I appreciate the opportunity to provide you with care for your health and wellness. Today we discussed: BP, Smoking cessation, DM   Follow up: 1 month phone with BP readings   No labs or referrals today  We will check labs around March time frame.  Call with BP readings, if you do not need to have the phone appt, just cancel.   Keep up the smoking cessation!!  Please continue to practice social distancing to keep you, your family, and our community safe.  If you must go out, please wear a mask and practice good handwashing.  It was a pleasure to see you and I look forward to continuing to work together on your health and well-being. Please do not hesitate to call the office if you need care or have questions about your care.  Have a wonderful day and week. With Gratitude, Tereasa Coop, DNP, AGNP-BC

## 2019-04-07 NOTE — Assessment & Plan Note (Addendum)
Reports better control of her blood sugars.  Is tolerating Metformin and glipizide at this time.  We will do a recheck of A1c in March. She is encouraged to make sure that she maintains a heart healthy/low-fat/diabetic diet.  In addition to working out 30 minutes at least 5 days a week.  Dawn Benjamin is encouraged to check blood sugar daily as directed. Continue current medications. Is on statin as well. Educated on importance of maintain a well balanced diabetic friendly diet. She is reminded the importance of maintaining  good blood sugars,  taking medications as directed, daily foot care, annual eye exams. Additionally educated about keeping good control over blood pressure and cholesterol as well.

## 2019-04-07 NOTE — Assessment & Plan Note (Signed)
Asked about quitting: confirms they are currently smokes cigarettes Advise to quit smoking: Educated about QUITTING to reduce the risk of cancer, cardio and cerebrovascular disease. Assess willingness: Unwilling to quit at this time, but is working on cutting back. Assist with counseling and pharmacotherapy: Counseled for 5 minutes and literature provided. Arrange for follow up: working on quitting follow up in 3 months and continue to offer help.

## 2019-04-07 NOTE — Progress Notes (Signed)
Subjective:  Patient ID: Dawn Benjamin, female    DOB: 1981/03/31  Age: 39 y.o. MRN: [id number]  CC:  Chief Complaint  Patient presents with  . Hypertension    follow up bp evaluation      HPI  HPI   Recently established new patient Dawn Benjamin is a 39 year old female patient.  Seen at the beginning of December secondary to having elevation of blood pressure and headache.  Urgent care had given her Catapres to take it first systolic was greater than 170, lisinopril hydrochlorothiazide combo.  She reports continuing to take these as directed but requires taking the Catapres daily to help keep her blood pressure and control otherwise her blood pressure ranges from the 140s to the 170s systolically.  Even with use of lisinopril hydrochlorothiazide combo. However she does report that she is feeling much better has not had any headaches, vision changes, shortness of breath, leg swelling or palpitations.  Additionally she reports she is down to 2 cigarettes a day.  Is not taking the cigarettes to work anymore she smokes 1 in the morning and maybe 1 after work but otherwise that is it.  In comparison is smoking 1/4 pack or more a day. She is also tolerating her Metformin and glipizide well and her blood sugars are ranging in the 100 range.  Overall she has no additional concerns or complaints today in the office.  Today patient denies signs and symptoms of COVID 19 infection including fever, chills, cough, shortness of breath, and headache. Past Medical, Surgical, Social History, Allergies, and Medications have been Reviewed.   Past Medical History:  Diagnosis Date  . Allergy   . Anxiety   . Diabetes mellitus without complication ([facility])   . Hypertension     Current Meds  Medication Sig  . cloNIDine (CATAPRES) 0.1 MG tablet Take 1 tablet (0.1 mg total) by mouth daily.  . Ferrous Sulfate (IRON) 325 (65 Fe) MG TABS Take 1 tablet (325 mg total) by mouth 2 (two) times daily  with a meal.  . glipiZIDE (GLUCOTROL XL) 10 MG 24 hr tablet Take 1 tablet (10 mg total) by mouth daily with breakfast.  . metFORMIN (GLUCOPHAGE) 500 MG tablet Take 1 tablet (500 mg total) by mouth 2 (two) times daily with a meal.  . rosuvastatin (CRESTOR) 5 MG tablet Take 1 tablet (5 mg total) by mouth daily.  . Vitamin D, Ergocalciferol, (DRISDOL) 1.25 MG (50000 UT) CAPS capsule Take 1 capsule (50,000 Units total) by mouth every 7 (seven) days.  . [DISCONTINUED] cloNIDine (CATAPRES) 0.1 MG tablet Take 1 tablet by mouth 2 (two) times daily as needed. Systolic bp greater than 170  . [DISCONTINUED] glipiZIDE (GLUCOTROL XL) 10 MG 24 hr tablet Take 1 tablet (10 mg total) by mouth daily with breakfast.  . [DISCONTINUED] lisinopril-hydrochlorothiazide (PRINZIDE,ZESTORETIC) 20-12.5 MG per tablet Take 1 tablet by mouth daily.  . [DISCONTINUED] metFORMIN (GLUCOPHAGE) 500 MG tablet Take 1 tablet (500 mg total) by mouth 2 (two) times daily with a meal.    ROS:  Review of Systems  Constitutional: Negative.   HENT: Negative.   Eyes: Negative.   Respiratory: Negative.   Cardiovascular: Negative.   Gastrointestinal: Negative.   Genitourinary: Negative.   Musculoskeletal: Negative.   Skin: Negative.   Neurological: Negative.   Endo/Heme/Allergies: Negative.   Psychiatric/Behavioral: Negative.   All other systems reviewed and are negative.    Objective:   Today's Vitals: BP (!) 168/88   Pulse 86  Temp 98.5 F (36.9 C) (Oral)   Resp 15   Ht 5\' 3"  (1.6 m)   Wt 160 lb 0.6 oz (72.6 kg)   SpO2 98%   BMI 28.35 kg/m  Vitals with BMI 04/07/2019 03/09/2019 09/23/2015  Height 5\' 3"  5\' 3"  -  Weight 160 lbs 1 oz 151 lbs 1 oz -  BMI 76.16 07.37 -  Systolic 106 269 485  Diastolic 88 80 95  Pulse 86 91 -     Physical Exam Vitals and nursing note reviewed.  Constitutional:      Appearance: Normal appearance. She is well-developed, well-groomed and overweight.  HENT:     Head: Normocephalic and  atraumatic.     Right Ear: External ear normal.     Left Ear: External ear normal.     Nose: Nose normal.     Mouth/Throat:     Mouth: Mucous membranes are moist.     Pharynx: Oropharynx is clear.  Eyes:     General:        Right eye: No discharge.        Left eye: No discharge.     Conjunctiva/sclera: Conjunctivae normal.  Cardiovascular:     Rate and Rhythm: Normal rate and regular rhythm.     Pulses: Normal pulses.     Heart sounds: Normal heart sounds.  Pulmonary:     Effort: Pulmonary effort is normal.     Breath sounds: Normal breath sounds.  Musculoskeletal:        General: Normal range of motion.     Cervical back: Normal range of motion and neck supple.  Skin:    General: Skin is warm.  Neurological:     General: No focal deficit present.     Mental Status: She is alert and oriented to person, place, and time.  Psychiatric:        Attention and Perception: Attention and perception normal.        Mood and Affect: Mood and affect normal.        Speech: Speech normal.        Behavior: Behavior normal. Behavior is cooperative.        Thought Content: Thought content normal.        Cognition and Memory: Cognition and memory normal.        Judgment: Judgment normal.     Assessment   1. Type 2 diabetes mellitus with hyperglycemia, unspecified whether long term insulin use ([place])   2. Hypertension, unspecified type   3. Cigarette nicotine dependence without complication     Tests ordered No orders of the defined types were placed in this encounter.   Plan: Please see assessment and plan per problem list above.   Meds ordered this encounter  Medications  . lisinopril-hydrochlorothiazide (ZESTORETIC) 20-25 MG tablet    Sig: Take 1 tablet by mouth daily.    Dispense:  30 tablet    Refill:  1    Order Specific Question:   Supervising Provider    Answer:   SIMPSON, MARGARET E [[id number]]  . cloNIDine (CATAPRES) 0.1 MG tablet    Sig: Take 1 tablet (0.1 mg total) by  mouth daily.    Dispense:  30 tablet    Refill:  1    Order Specific Question:   Supervising Provider    Answer:   SIMPSON, MARGARET E [[id number]]  . glipiZIDE (GLUCOTROL XL) 10 MG 24 hr tablet    Sig: Take 1 tablet (10 mg total) by  mouth daily with breakfast.    Dispense:  90 tablet    Refill:  1    Order Specific Question:   Supervising Provider    Answer:   SIMPSON, MARGARET E [[id number]]  . metFORMIN (GLUCOPHAGE) 500 MG tablet    Sig: Take 1 tablet (500 mg total) by mouth 2 (two) times daily with a meal.    Dispense:  90 tablet    Refill:  1    Order Specific Question:   Supervising Provider    Answer:   Kerri Perches [[id number]]    Patient to follow-up in 1 month phone with BP readings  Freddy Finner, NP

## 2019-04-11 ENCOUNTER — Encounter (INDEPENDENT_AMBULATORY_CARE_PROVIDER_SITE_OTHER): Payer: Managed Care, Other (non HMO) | Admitting: Ophthalmology

## 2019-04-25 ENCOUNTER — Other Ambulatory Visit: Payer: Self-pay

## 2019-04-25 ENCOUNTER — Encounter: Payer: Self-pay | Admitting: Family Medicine

## 2019-04-25 DIAGNOSIS — Z124 Encounter for screening for malignant neoplasm of cervix: Secondary | ICD-10-CM

## 2019-05-05 ENCOUNTER — Encounter: Payer: Self-pay | Admitting: Family Medicine

## 2019-05-09 ENCOUNTER — Other Ambulatory Visit (HOSPITAL_COMMUNITY)
Admission: RE | Admit: 2019-05-09 | Discharge: 2019-05-09 | Disposition: A | Discharge status: Home / Self Care | Payer: Managed Care, Other (non HMO) | Source: Ambulatory Visit | Attending: Advanced Practice Midwife | Admitting: Advanced Practice Midwife

## 2019-05-09 ENCOUNTER — Other Ambulatory Visit: Payer: Self-pay

## 2019-05-09 ENCOUNTER — Encounter: Payer: Self-pay | Admitting: Advanced Practice Midwife

## 2019-05-09 ENCOUNTER — Ambulatory Visit (INDEPENDENT_AMBULATORY_CARE_PROVIDER_SITE_OTHER): Payer: Managed Care, Other (non HMO) | Admitting: Advanced Practice Midwife

## 2019-05-09 VITALS — BP 155/93 | HR 82 | Ht 63.0 in | Wt 166.0 lb

## 2019-05-09 DIAGNOSIS — Z01419 Encounter for gynecological examination (general) (routine) without abnormal findings: Secondary | ICD-10-CM

## 2019-05-09 DIAGNOSIS — N920 Excessive and frequent menstruation with regular cycle: Secondary | ICD-10-CM

## 2019-05-09 HISTORY — DX: Excessive and frequent menstruation with regular cycle: N92.0

## 2019-05-09 NOTE — Progress Notes (Signed)
   WELL-WOMAN EXAMINATION Patient name: Dawn Benjamin MRN [id number]  Date of birth: 07/30/1980 Chief Complaint:   Annual Exam  History of Present Illness:   Dawn Benjamin is a 39 y.o. G1P1 African American female being seen today for a routine well-woman exam.  Current complaints: requests GC/chlam with Pap; reports reg, heavy, cycles (q28d, lasting 7d); may be interested in contraception/something to help with menorrhagia  PCP: Tereasa Coop NP      does not desire labs Patient's last menstrual period was 04/25/2019. The current method of family planning is condoms.  Last pap not sure.  H/O abnormal pap: No Last mammogram: a few years ago for suspicious lump. Results were: normal. Family h/o breast cancer: Yes- aunt Last colonoscopy: never.  Family h/o colorectal cancer: Yes- mother dx at age 85 Review of Systems:   Pertinent items are noted in HPI Denies any headaches, blurred vision, fatigue, shortness of breath, chest pain, abdominal pain, abnormal vaginal discharge/itching/odor/irritation, problems with periods, bowel movements, urination, or intercourse unless otherwise stated above. Pertinent History Reviewed:  Reviewed past medical,surgical, social and family history.  Reviewed problem list, medications and allergies. Physical Assessment:   Vitals:   05/09/19 1036  BP: (!) 155/93  Pulse: 82  Weight: 166 lb (75.3 kg)  Height: 5\' 3"  (1.6 m)  Body mass index is 29.41 kg/m.        Physical Examination:   General appearance - well appearing, and in no distress  Mental status - alert, oriented to person, place, and time  Psych:  She has a normal mood and affect  Skin - warm and dry, normal color, no suspicious lesions noted  Chest - effort normal, all lung fields clear to auscultation bilaterally  Heart - normal rate and regular rhythm  Neck:  midline trachea, no thyromegaly or nodules  Breasts - breasts appear normal, no suspicious masses, no skin or nipple  changes or  axillary nodes  Abdomen - soft, nontender, nondistended, no masses or organomegaly  Pelvic - VULVA: normal appearing vulva with no masses, tenderness or lesions  VAGINA: normal appearing vagina with normal color and discharge, no lesions  CERVIX: normal appearing cervix without discharge or lesions, no CMT  Thin prep pap is done with HR HPV cotesting  UTERUS: uterus is felt to be normal size, shape, consistency and nontender   ADNEXA: No adnexal masses or tenderness noted.  Extremities:  No swelling or varicosities noted  Chaperone: Peggy Dones    No results found for this or any previous visit (from the past 24 hour(s)).  Assessment & Plan:  1) Well-Woman Exam  2) cHTN  3) DM2  4) Menorrhagia with regular cycles, literature given for Mirena/Liletta; call to give a heads up, then schedule when on period if interested  Labs/procedures today: Pap/GC/chlam  Mammogram ago 39 or sooner if problems Colonoscopy age 89 or sooner if problems  No orders of the defined types were placed in this encounter.   Meds: No orders of the defined types were placed in this encounter.   Follow-up: No follow-ups on file.  44 CNM 05/09/2019 10:55 AM

## 2019-05-10 ENCOUNTER — Ambulatory Visit: Payer: Managed Care, Other (non HMO) | Admitting: Family Medicine

## 2019-05-10 LAB — CYTOLOGY - PAP
Chlamydia: NEGATIVE
Comment: NEGATIVE
Comment: NEGATIVE
Comment: NORMAL
Diagnosis: NEGATIVE
High risk HPV: NEGATIVE
Neisseria Gonorrhea: NEGATIVE

## 2019-05-12 ENCOUNTER — Ambulatory Visit (INDEPENDENT_AMBULATORY_CARE_PROVIDER_SITE_OTHER): Payer: Managed Care, Other (non HMO) | Admitting: Family Medicine

## 2019-05-12 ENCOUNTER — Other Ambulatory Visit: Payer: Self-pay

## 2019-05-12 ENCOUNTER — Encounter: Payer: Self-pay | Admitting: Family Medicine

## 2019-05-12 VITALS — BP 110/80 | HR 88 | Temp 97.6°F | Resp 15 | Ht 63.0 in | Wt 161.0 lb

## 2019-05-12 DIAGNOSIS — I10 Essential (primary) hypertension: Secondary | ICD-10-CM | POA: Diagnosis not present

## 2019-05-12 DIAGNOSIS — E663 Overweight: Secondary | ICD-10-CM

## 2019-05-12 NOTE — Assessment & Plan Note (Signed)
Dawn Benjamin is encouraged to maintain a well balanced diet that is low in salt. Controlled, continue current medication regimen.  No refills needed. The change in medications from 04/07/2019 visit is working well. She reports feeling better too.  Additionally, she is also reminded that exercise is beneficial for heart health and control of  Blood pressure. 30-60 minutes daily is recommended-walking was suggested.

## 2019-05-12 NOTE — Assessment & Plan Note (Signed)
Dawn Benjamin is educated about the importance of exercise daily to help with weight management. A minumum of 30 minutes daily is recommended. Additionally, importance of healthy food choices  with portion control discussed.  Wt Readings from Last 3 Encounters:  05/12/19 161 lb 0.6 oz (73 kg)  05/09/19 166 lb (75.3 kg)  04/07/19 160 lb 0.6 oz (72.6 kg)   Is working to not smoke, advised to have healthy snacks around

## 2019-05-12 NOTE — Patient Instructions (Addendum)
Happy New Year! May you have a year filled with hope, love, happiness and laughter.  I appreciate the opportunity to provide you with care for your health and wellness. Today we discussed: BP check   Follow up: Oct annual with no pap -morning fasting to get labs  No labs or referrals today  GOALS: Work on smoking cessation Eating a better, more well balanced diet  Drink more water  Please continue to practice social distancing to keep you, your family, and our community safe.  If you must go out, please wear a mask and practice good handwashing.  It was a pleasure to see you and I look forward to continuing to work together on your health and well-being. Please do not hesitate to call the office if you need care or have questions about your care.  Have a wonderful day and week. With Gratitude, Tereasa Coop, DNP, AGNP-BC

## 2019-05-12 NOTE — Progress Notes (Signed)
Subjective:  Patient ID: Dawn Benjamin, female    DOB: February 09, 1981  Age: 39 y.o. MRN: [id number]  CC:  Chief Complaint  Patient presents with  . Hypertension    bp evaluation      HPI  HPI  Recently established new patient Dawn Benjamin is a 39 year old female patient.  Seen at the beginning of December secondary to having elevation of blood pressure and headache.    At last visit I increased her lisinopril hydrochlorothiazide combo and made her Catapres daily.  She reports taking her blood pressure at home her blood pressure ranging 120-130 systolically over the 70s to 80s diastolically.   She does report that she is feeling much better has not had any headaches, vision changes, shortness of breath, leg swelling or palpitations.   She did go to the GYN office and smoked a cigarette before coming in and her blood pressure was elevated.  But outside of that she has not seen her blood pressure that elevated since medication changes last month.  Today patient denies signs and symptoms of COVID 19 infection including fever, chills, cough, shortness of breath, and headache. Past Medical, Surgical, Social History, Allergies, and Medications have been Reviewed.   Past Medical History:  Diagnosis Date  . Allergy   . Anxiety   . Diabetes mellitus without complication (HCC)   . Hypertension     Current Meds  Medication Sig  . cloNIDine (CATAPRES) 0.1 MG tablet Take 1 tablet (0.1 mg total) by mouth daily.  . Ferrous Sulfate (IRON) 325 (65 Fe) MG TABS Take 1 tablet (325 mg total) by mouth 2 (two) times daily with a meal.  . glipiZIDE (GLUCOTROL XL) 10 MG 24 hr tablet Take 1 tablet (10 mg total) by mouth daily with breakfast.  . lisinopril-hydrochlorothiazide (ZESTORETIC) 20-25 MG tablet Take 1 tablet by mouth daily.  . metFORMIN (GLUCOPHAGE) 500 MG tablet Take 1 tablet (500 mg total) by mouth 2 (two) times daily with a meal.  . rosuvastatin (CRESTOR) 5 MG tablet Take 1 tablet  (5 mg total) by mouth daily.  . Vitamin D, Ergocalciferol, (DRISDOL) 1.25 MG (50000 UT) CAPS capsule Take 1 capsule (50,000 Units total) by mouth every 7 (seven) days.    ROS:  Review of Systems  Constitutional: Negative.   HENT: Negative.   Eyes: Negative.   Respiratory: Negative.   Cardiovascular: Negative.   Gastrointestinal: Negative.   Genitourinary: Negative.   Musculoskeletal: Negative.   Skin: Negative.   Neurological: Negative.   Endo/Heme/Allergies: Negative.   Psychiatric/Behavioral: Negative.   All other systems reviewed and are negative.    Objective:   Today's Vitals: BP 110/80   Pulse 88   Temp 97.6 F (36.4 C) (Temporal)   Resp 15   Ht 5\' 3"  (1.6 m)   Wt 161 lb 0.6 oz (73 kg)   LMP 04/25/2019   SpO2 97%   BMI 28.53 kg/m  Vitals with BMI 05/12/2019 05/09/2019 04/07/2019  Height 5\' 3"  5\' 3"  5\' 3"   Weight 161 lbs 1 oz 166 lbs 160 lbs 1 oz  BMI 28.53 29.41 28.36  Systolic 110 155 06/05/2019  Diastolic 80 93 88  Pulse 88 82 86     Physical Exam Vitals and nursing note reviewed.  Constitutional:      Appearance: Normal appearance. She is well-developed, well-groomed and overweight.  HENT:     Head: Normocephalic and atraumatic. Head contusion: mask in place      Right Ear: External  ear normal.     Left Ear: External ear normal.  Eyes:     General:        Right eye: No discharge.        Left eye: No discharge.     Conjunctiva/sclera: Conjunctivae normal.  Cardiovascular:     Rate and Rhythm: Normal rate and regular rhythm.     Pulses: Normal pulses.     Heart sounds: Normal heart sounds.  Pulmonary:     Effort: Pulmonary effort is normal.     Breath sounds: Normal breath sounds.  Musculoskeletal:        General: Normal range of motion.     Cervical back: Normal range of motion and neck supple.  Skin:    General: Skin is warm.  Neurological:     General: No focal deficit present.     Mental Status: She is alert and oriented to person, place, and  time.  Psychiatric:        Attention and Perception: Attention normal.        Mood and Affect: Mood normal.        Speech: Speech normal.        Behavior: Behavior normal. Behavior is cooperative.        Thought Content: Thought content normal.        Cognition and Memory: Cognition normal.        Judgment: Judgment normal.        Assessment   1. Hypertension, unspecified type   2. Overweight (BMI 25.0-29.9)     Tests ordered No orders of the defined types were placed in this encounter.    Plan: Please see assessment and plan per problem list above.   No orders of the defined types were placed in this encounter.   Patient to follow-up in Oct for annual .  Perlie Mayo, NP

## 2019-07-13 ENCOUNTER — Other Ambulatory Visit: Payer: Self-pay | Admitting: *Deleted

## 2019-07-13 DIAGNOSIS — E559 Vitamin D deficiency, unspecified: Secondary | ICD-10-CM

## 2019-07-13 MED ORDER — VITAMIN D (ERGOCALCIFEROL) 1.25 MG (50000 UNIT) PO CAPS: 50000.0000 [IU] | ORAL_CAPSULE | ORAL | 3 refills | Status: DC

## 2019-09-05 ENCOUNTER — Other Ambulatory Visit: Payer: Self-pay | Admitting: *Deleted

## 2019-09-05 DIAGNOSIS — I10 Essential (primary) hypertension: Secondary | ICD-10-CM

## 2019-09-05 MED ORDER — LISINOPRIL-HYDROCHLOROTHIAZIDE 20-25 MG PO TABS
1.0000 | ORAL_TABLET | Freq: Every day | ORAL | 1 refills | Status: DC
Start: 2019-09-05 — End: 2021-01-17

## 2020-01-12 ENCOUNTER — Encounter: Payer: Managed Care, Other (non HMO) | Admitting: Family Medicine

## 2020-01-26 ENCOUNTER — Encounter: Payer: Self-pay | Admitting: Family Medicine

## 2020-01-26 ENCOUNTER — Other Ambulatory Visit: Payer: Self-pay

## 2020-01-26 ENCOUNTER — Ambulatory Visit (INDEPENDENT_AMBULATORY_CARE_PROVIDER_SITE_OTHER): Payer: Managed Care, Other (non HMO) | Admitting: Family Medicine

## 2020-01-26 VITALS — BP 116/70 | HR 92 | Temp 97.6°F | Ht 63.0 in | Wt 159.0 lb

## 2020-01-26 DIAGNOSIS — E663 Overweight: Secondary | ICD-10-CM | POA: Diagnosis not present

## 2020-01-26 DIAGNOSIS — Z1231 Encounter for screening mammogram for malignant neoplasm of breast: Secondary | ICD-10-CM | POA: Insufficient documentation

## 2020-01-26 DIAGNOSIS — Z0001 Encounter for general adult medical examination with abnormal findings: Secondary | ICD-10-CM | POA: Diagnosis not present

## 2020-01-26 DIAGNOSIS — I10 Essential (primary) hypertension: Secondary | ICD-10-CM | POA: Diagnosis not present

## 2020-01-26 DIAGNOSIS — E1165 Type 2 diabetes mellitus with hyperglycemia: Secondary | ICD-10-CM

## 2020-01-26 DIAGNOSIS — E782 Mixed hyperlipidemia: Secondary | ICD-10-CM

## 2020-01-26 DIAGNOSIS — E559 Vitamin D deficiency, unspecified: Secondary | ICD-10-CM | POA: Insufficient documentation

## 2020-01-26 DIAGNOSIS — F17210 Nicotine dependence, cigarettes, uncomplicated: Secondary | ICD-10-CM

## 2020-01-26 LAB — POCT GLYCOSYLATED HEMOGLOBIN (HGB A1C): Hemoglobin A1C: 14 % — AB (ref 4.0–5.6)

## 2020-01-26 NOTE — Assessment & Plan Note (Addendum)
Not well controlled, urgent referral to Endo A2c up to 14% from last check of 12.9% She has not come in to her appts since early part of the year. Reports fasting levels of 120-140, but states higher at times On statin and ace Foot exam today Education provided on diet recommendations Will wait for Endo, but educated patient she mostly likely need insulin and a strict diet.  Patient acknowledged agreement and understanding of the plan.

## 2020-01-26 NOTE — Assessment & Plan Note (Signed)
Dawn Benjamin is encouraged to maintain a well balanced diet that is low in salt. Controlled, continue current medication regimen. Additionally, she is also reminded that exercise is beneficial for heart health and control of  Blood pressure. 30-60 minutes daily is recommended-walking was suggested.

## 2020-01-26 NOTE — Assessment & Plan Note (Signed)
Improved  Dawn Benjamin is educated about the importance of exercise daily to help with weight management. A minumum of 30 minutes daily is recommended. Additionally, importance of healthy food choices  with portion control discussed.   Wt Readings from Last 3 Encounters:  01/26/20 159 lb (72.1 kg)  05/12/19 161 lb 0.6 oz (73 kg)  05/09/19 166 lb (75.3 kg)

## 2020-01-26 NOTE — Assessment & Plan Note (Signed)
Heart healthy diet encouraged.  Updated labs ordered. °

## 2020-01-26 NOTE — Assessment & Plan Note (Signed)
Discussed monthly self breast exams and yearly mammograms; at least 30 minutes of aerobic activity at least 5 days/week and weight-bearing exercise 2x/week; proper sunscreen use reviewed; healthy diet, including goals of calcium and vitamin D intake and alcohol recommendations (less than or equal to 1 drink/day) reviewed; regular seatbelt use; changing batteries in smoke detectors.  Immunization recommendations discussed.  Colonoscopy recommendations reviewed.  

## 2020-01-26 NOTE — Progress Notes (Signed)
Health Maintenance reviewed -    There is no immunization history on file for this patient. Last Pap smear: 05/2019 Last mammogram: Ordered  Last colonoscopy: N/A Last DEXA: N/A Dentist:  Needs to make appt  Ophtho: yearly  Exercise: nothing structured Smoker: 1/2 pk daily  Alcohol Use: none  Other doctors caring for patient include:  Patient Care Team: Freddy Finner, NP as PCP - General (Family Medicine)  End of Life Discussion:  Patient does not have a living will and medical power of attorney  Subjective:   HPI  Dawn Benjamin is a 39 y.o. female who presents for annual comprehensive visit and follow-up on chronic medical conditions.  She has the following concerns: skin concern  Reports sleeping well. Denies trouble chewing or swallowing. Appetite is good. Denies changes in bladder or bowel habits. Denies blood in urine or stool. Denies trouble with memory. Denies recently falls or injury. Denies hearing or vision changes. Reports some skin issues- areas on the back- itching and sore. Denies exposure to any allergen she is aware of. No change in personal hygiene products. No fevers, chills.   Declined flu vaccine today. Has not had covid vaccine either  Needs DM eye exam, mammogram  (ordered today)  Review Of Systems  Review of Systems  Constitutional: Negative.   HENT: Negative.   Eyes: Negative.   Respiratory: Negative.   Cardiovascular: Negative.   Gastrointestinal: Negative.   Endocrine: Negative.   Genitourinary: Negative.   Musculoskeletal: Negative.   Skin: Negative.   Neurological: Negative.   Psychiatric/Behavioral: Negative.     Objective:   PHYSICAL EXAM:  BP 116/70 (BP Location: Right Arm, Patient Position: Sitting, Cuff Size: Normal)   Pulse 92   Temp 97.6 F (36.4 C) (Tympanic)   Ht 5\' 3"  (1.6 m)   Wt 159 lb (72.1 kg)   LMP 01/22/2020   SpO2 99%   BMI 28.17 kg/m   Physical Exam Vitals and nursing note reviewed.    Constitutional:      Appearance: Normal appearance. She is well-developed, well-groomed and overweight.  HENT:     Head: Normocephalic.     Right Ear: Hearing, tympanic membrane, ear canal and external ear normal.     Left Ear: Hearing, tympanic membrane, ear canal and external ear normal.     Nose: Nose normal.     Mouth/Throat:     Lips: Pink.     Mouth: Mucous membranes are moist.     Pharynx: Oropharynx is clear. Uvula midline.  Eyes:     General: Lids are normal.     Extraocular Movements: Extraocular movements intact.     Conjunctiva/sclera: Conjunctivae normal.     Pupils: Pupils are equal, round, and reactive to light.  Neck:     Thyroid: No thyroid mass, thyromegaly or thyroid tenderness.     Vascular: No carotid bruit.  Cardiovascular:     Rate and Rhythm: Normal rate and regular rhythm.     Pulses: Normal pulses.          Radial pulses are 2+ on the right side and 2+ on the left side.       Dorsalis pedis pulses are 2+ on the right side and 2+ on the left side.       Posterior tibial pulses are 2+ on the right side and 2+ on the left side.     Heart sounds: Normal heart sounds.  Pulmonary:     Effort: Pulmonary effort is  normal.     Breath sounds: Normal breath sounds.  Abdominal:     General: Abdomen is flat. Bowel sounds are normal.     Palpations: Abdomen is soft.     Tenderness: There is no abdominal tenderness. There is no right CVA tenderness or left CVA tenderness.     Hernia: No hernia is present.  Musculoskeletal:        General: Normal range of motion.     Cervical back: Full passive range of motion without pain, normal range of motion and neck supple.     Right lower leg: No edema.     Left lower leg: No edema.     Right foot: Normal range of motion.     Left foot: Normal range of motion.     Comments: MAE, ROM intact   Feet:     Right foot:     Protective Sensation: 5 sites tested. 5 sites sensed.     Skin integrity: Skin integrity normal.      Toenail Condition: Right toenails are normal.     Left foot:     Protective Sensation: 5 sites tested. 5 sites sensed.     Skin integrity: Skin integrity normal.     Toenail Condition: Left toenails are normal.  Lymphadenopathy:     Cervical: No cervical adenopathy.  Skin:    General: Skin is warm and dry.     Capillary Refill: Capillary refill takes less than 2 seconds.  Neurological:     General: No focal deficit present.     Mental Status: She is alert and oriented to person, place, and time. Mental status is at baseline.     Cranial Nerves: Cranial nerves are intact.     Sensory: Sensation is intact.     Motor: Motor function is intact.     Gait: Gait is intact.     Deep Tendon Reflexes: Reflexes are normal and symmetric.  Psychiatric:        Attention and Perception: Attention normal.        Mood and Affect: Mood normal.        Behavior: Behavior normal. Behavior is cooperative.        Thought Content: Thought content normal.        Judgment: Judgment normal.    Diabetic Foot Form - Detailed   Diabetic Foot Exam - detailed Diabetic Foot exam was performed with the following findings: Yes 01/26/2020 12:58 PM  Visual Foot Exam completed.: Yes  Can the patient see the bottom of their feet?: Yes Are the shoes appropriate in style and fit?: Yes Is there swelling or and abnormal foot shape?: No Is there a claw toe deformity?: No Is there elevated skin temparature?: No Is there foot or ankle muscle weakness?: No Normal Range of Motion: Yes Pulse Foot Exam completed.: Yes  Right posterior Tibialias: Present Left posterior Tibialias: Present  Right Dorsalis Pedis: Present Left Dorsalis Pedis: Present  Sensory Foot Exam Completed.: Yes Semmes-Weinstein Monofilament Test R Site 1-Great Toe: Pos L Site 1-Great Toe: Pos          Depression Screening  Depression screen [facility] 2/9 01/26/2020 01/26/2020 05/12/2019 04/07/2019 03/09/2019  Decreased Interest 0 0 0 0 0  Down,  Depressed, Hopeless 0 0 0 0 1  PHQ - 2 Score 0 0 0 0 1     Falls  Fall Risk  01/26/2020 05/12/2019 05/09/2019 04/07/2019 03/09/2019  Falls in the past year? 0 0 0 0 0  Number  falls in past yr: 0 0 0 0 0  Injury with Fall? 0 0 0 0 0  Risk for fall due to : No Fall Risks - - - -  Follow up Falls evaluation completed - - - -    Assessment & Plan:   1. Annual visit for general adult medical examination with abnormal findings   2. Primary hypertension   3. Cigarette nicotine dependence without complication   4. Overweight (BMI 25.0-29.9)   5. Type 2 diabetes mellitus with hyperglycemia, unspecified whether long term insulin use (HCC)   6. Mixed hyperlipidemia   7. Vitamin D deficiency   8. Encounter for screening mammogram for malignant neoplasm of breast     Tests ordered Orders Placed This Encounter  Procedures  . MM 3D SCREEN BREAST BILATERAL  . CBC  . Comprehensive metabolic panel  . Lipid panel  . TSH  . VITAMIN D 25 Hydroxy (Vit-D Deficiency, Fractures)  . Ambulatory referral to Endocrinology  . Ambulatory referral to Ophthalmology  . POCT glycosylated hemoglobin (Hb A1C)     Plan: Please see assessment and plan per problem list above.   No orders of the defined types were placed in this encounter.   I have personally reviewed: The patient's medical and social history Their use of alcohol, tobacco or illicit drugs Their current medications and supplements The patient's functional ability including ADLs,fall risks, home safety risks, cognitive, and hearing and visual impairment Diet and physical activities Evidence for depression or mood disorders  The patient's weight, height, BMI, and visual acuity have been recorded in the chart.  I have made referrals, counseling, and provided education to the patient based on review of the above and I have provided the patient with a written personalized care plan for preventive services.    Note: This dictation was prepared  with Dragon dictation along with smaller phrase technology. Similar sounding words can be transcribed inadequately or may not be corrected upon review. Any transcriptional errors that result from this process are unintentional.      Freddy Finner, NP   01/26/2020

## 2020-01-26 NOTE — Patient Instructions (Addendum)
  HAPPY FALL!  I appreciate the opportunity to provide you with care for your health and wellness. Today we discussed: overall health   Follow up: 3 months for A1C check  Labs- fasting at [facility] tomorrow or Monday morning Referrals today: DM eye exam, Endocrinology for diabetes care Orders:  Mammogram A1c today _14%____  Please review Diabetic information provided today.  Have a good Holiday Season :)  Please continue to practice social distancing to keep you, your family, and our community safe.  If you must go out, please wear a mask and practice good handwashing.  It was a pleasure to see you and I look forward to continuing to work together on your health and well-being. Please do not hesitate to call the office if you need care or have questions about your care.  Have a wonderful day and week. With Gratitude, Tereasa Coop, DNP, AGNP-BC  HEALTH MAINTENANCE RECOMMENDATIONS:  It is recommended that you get at least 30 minutes of aerobic exercise at least 5 days/week (for weight loss, you may need as much as 60-90 minutes). This can be any activity that gets your heart rate up. This can be divided in 10-15 minute intervals if needed, but try and build up your endurance at least once a week.  Weight bearing exercise is also recommended twice weekly.  Eat a healthy diet with lots of vegetables, fruits and fiber.  "Colorful" foods have a lot of vitamins (ie green vegetables, tomatoes, red peppers, etc).  Limit sweet tea, regular sodas and alcoholic beverages, all of which has a lot of calories and sugar.  Up to 1 alcoholic drink daily may be beneficial for women (unless trying to lose weight, watch sugars).  Drink a lot of water.  Calcium recommendations are 1200-1500 mg daily (1500 mg for postmenopausal women or women without ovaries), and vitamin D 1000 IU daily.  This should be obtained from diet and/or supplements (vitamins), and calcium should not be taken all at once, but in  divided doses.  Monthly self breast exams and yearly mammograms for women over the age of 6 is recommended.  Sunscreen of at least SPF 30 should be used on all sun-exposed parts of the skin when outside between the hours of 10 am and 4 pm (not just when at beach or pool, but even with exercise, golf, tennis, and yard work!)  Use a sunscreen that says "broad spectrum" so it covers both UVA and UVB rays, and make sure to reapply every 1-2 hours.  Remember to change the batteries in your smoke detectors when changing your clock times in the spring and fall.  Use your seat belt every time you are in a car, and please drive safely and not be distracted with cell phones and texting while driving.

## 2020-01-26 NOTE — Assessment & Plan Note (Signed)
Asked about quitting: confirms they are smoking 1/2 pk cigarettes Advise to quit smoking: Educated about QUITTING to reduce the risk of cancer, cardio and cerebrovascular disease. Assess willingness: Unwilling to quit at this time, but is working on cutting back. Assist with counseling and pharmacotherapy: Counseled for 5 minutes and literature provided. Arrange for follow up: not quitting follow up in 3 months and continue to offer help.

## 2020-01-30 ENCOUNTER — Ambulatory Visit: Payer: Managed Care, Other (non HMO) | Admitting: Nurse Practitioner

## 2020-01-31 ENCOUNTER — Encounter: Payer: Self-pay | Admitting: Nurse Practitioner

## 2020-01-31 ENCOUNTER — Ambulatory Visit (INDEPENDENT_AMBULATORY_CARE_PROVIDER_SITE_OTHER): Payer: Managed Care, Other (non HMO) | Admitting: Nurse Practitioner

## 2020-01-31 ENCOUNTER — Other Ambulatory Visit: Payer: Self-pay

## 2020-01-31 VITALS — BP 150/100 | HR 105 | Ht 63.0 in | Wt 161.0 lb

## 2020-01-31 DIAGNOSIS — E1165 Type 2 diabetes mellitus with hyperglycemia: Secondary | ICD-10-CM | POA: Diagnosis not present

## 2020-01-31 DIAGNOSIS — E559 Vitamin D deficiency, unspecified: Secondary | ICD-10-CM

## 2020-01-31 DIAGNOSIS — I10 Essential (primary) hypertension: Secondary | ICD-10-CM

## 2020-01-31 DIAGNOSIS — E782 Mixed hyperlipidemia: Secondary | ICD-10-CM | POA: Diagnosis not present

## 2020-01-31 MED ORDER — PEN NEEDLES 32G X 4 MM MISC: 1.0000 "application " | Freq: Every day | 3 refills | Status: DC

## 2020-01-31 MED ORDER — METFORMIN HCL ER 500 MG PO TB24: 500.0000 mg | ORAL_TABLET | Freq: Every day | ORAL | 3 refills | Status: DC

## 2020-01-31 MED ORDER — LANTUS SOLOSTAR 100 UNIT/ML ~~LOC~~ SOPN: 20.0000 [IU] | PEN_INJECTOR | Freq: Every day | SUBCUTANEOUS | 3 refills | Status: DC

## 2020-01-31 NOTE — Patient Instructions (Signed)

## 2020-01-31 NOTE — Progress Notes (Signed)
Endocrinology Consult Note       01/31/2020, 3:53 PM   Subjective:    Patient ID: Dawn Benjamin, female    DOB: [birth year].  Dawn Benjamin is being seen in consultation for management of currently uncontrolled symptomatic diabetes requested by  Freddy Finner, NP.   Past Medical History:  Diagnosis Date  . Allergy   . Anxiety   . Diabetes mellitus without complication (HCC)   . Hypertension   . Menorrhagia 05/09/2019   Regular cycles    Past Surgical History:  Procedure Laterality Date  . CHOLECYSTECTOMY    . DENTAL SURGERY      Social History   Socioeconomic History  . Marital status: Single    Spouse name: Not on file  . Number of children: 1  . Years of education: Not on file  . Highest education level: 12th grade  Occupational History  . Occupation: CNA     Comment: [employer]  Tobacco Use  . Smoking status: Current Every Day Smoker    Packs/day: 0.10    Types: Cigarettes  . Smokeless tobacco: Never Used  Vaping Use  . Vaping Use: Never used  Substance and Sexual Activity  . Alcohol use: Yes  . Drug use: No  . Sexual activity: Yes    Birth control/protection: Condom  Other Topics Concern  . Not on file  Social History Narrative   Lives with son and boyfriend   Son-[name] 22 this month 03/2019      Enjoys: shopping and spending time with girlfriends      Diet: was eating fried foods, but is working to [employer]- does like veggies and fruits   Caffeine: pepsi-20 oz; some sweet tea-weekly    Water: 3-4 bottles daily of 8 oz      Wears seat belt   Smoke detectors at home   Does not use phone while driving      Social Determinants of Health   Financial Resource Strain: Low Risk   . Difficulty of Paying Living Expenses: Not hard at all  Food Insecurity: No Food Insecurity  . Worried About Programme researcher, broadcasting/film/video in the Last Year: Never true  . Ran Out of  Food in the Last Year: Never true  Transportation Needs: No Transportation Needs  . Lack of Transportation (Medical): No  . Lack of Transportation (Non-Medical): No  Physical Activity: Insufficiently Active  . Days of Exercise per Week: 3 days  . Minutes of Exercise per Session: 20 min  Stress: No Stress Concern Present  . Feeling of Stress : Not at all  Social Connections: Socially Isolated  . Frequency of Communication with Friends and Family: More than three times a week  . Frequency of Social Gatherings with Friends and Family: Three times a week  . Attends Religious Services: Never  . Active Member of Clubs or Organizations: No  . Attends Banker Meetings: Never  . Marital Status: Never married    Family History  Problem Relation Age of Onset  . Hypertension Mother   . Diabetes Mother   . Cancer Mother  colon  . Diabetes Paternal Aunt   . Hypertension Paternal Aunt     Outpatient Encounter Medications as of 01/31/2020  Medication Sig  . cloNIDine (CATAPRES) 0.1 MG tablet Take 1 tablet (0.1 mg total) by mouth daily.  . Ferrous Sulfate (IRON) 325 (65 Fe) MG TABS Take 1 tablet (325 mg total) by mouth 2 (two) times daily with a meal.  . glipiZIDE (GLUCOTROL XL) 10 MG 24 hr tablet Take 1 tablet (10 mg total) by mouth daily with breakfast.  . lisinopril-hydrochlorothiazide (ZESTORETIC) 20-25 MG tablet Take 1 tablet by mouth daily.  . rosuvastatin (CRESTOR) 5 MG tablet Take 1 tablet (5 mg total) by mouth daily.  . [DISCONTINUED] metFORMIN (GLUCOPHAGE) 500 MG tablet Take 1 tablet (500 mg total) by mouth 2 (two) times daily with a meal.  . insulin glargine (LANTUS SOLOSTAR) 100 UNIT/ML Solostar Pen Inject 20 Units into the skin at bedtime.  . Insulin Pen Needle (PEN NEEDLES) 32G X 4 MM MISC 1 application by Does not apply route at bedtime.  . metFORMIN (GLUCOPHAGE XR) 500 MG 24 hr tablet Take 1 tablet (500 mg total) by mouth daily with breakfast.   No  facility-administered encounter medications on file as of 01/31/2020.    ALLERGIES: No Known Allergies  VACCINATION STATUS:  There is no immunization history on file for this patient.  Diabetes She presents for her initial diabetic visit. She has type 2 diabetes mellitus. Onset time: She was diagnosed at approximate age of 39. Her disease course has been worsening. There are no hypoglycemic associated symptoms. Associated symptoms include blurred vision, fatigue, polydipsia, polyphagia and polyuria. Pertinent negatives for diabetes include no weight loss. There are no hypoglycemic complications. Symptoms are worsening. Risk factors for coronary artery disease include diabetes mellitus, hypertension, tobacco exposure and dyslipidemia. Current diabetic treatment includes oral agent (dual therapy). She is compliant with treatment most of the time. Her weight is decreasing steadily. She is following a generally unhealthy diet. When asked about meal planning, she reported none. She has not had a previous visit with a dietitian. She rarely participates in exercise. (She presents today for her initial consultation without any meter or logs.  She does not routinely monitor her glucose at home.  Her previsit A1C was 14%.  She was diagnosed with diabetes in Jan of 2021 incidentally when she went to urgent care for not feeling well.  Her PCP put her on Metformin 500 mg twice daily (which she has not been taking due to adverse GI affects) and Glipizide 10 mg XL.  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  She denies any s/s of hypoglycemia, but reports hypoglycemia is a real fear of hers.) An ACE inhibitor/angiotensin II receptor blocker is being taken. She does not see a podiatrist.Eye exam is current.  Hyperlipidemia This is a chronic problem. The current episode started more than 1 year ago. The problem is uncontrolled. Recent lipid tests were reviewed and are high. Exacerbating diseases  include diabetes. Factors aggravating her hyperlipidemia include thiazides and smoking. Current antihyperlipidemic treatment includes statins. The current treatment provides mild improvement of lipids. Compliance problems include adherence to diet and adherence to exercise.  Risk factors for coronary artery disease include dyslipidemia, diabetes mellitus and hypertension.  Hypertension This is a chronic problem. The current episode started more than 1 year ago. The problem has been gradually improving since onset. The problem is uncontrolled. Associated symptoms include blurred vision. There are no associated agents  to hypertension. Risk factors for coronary artery disease include diabetes mellitus, dyslipidemia and smoking/tobacco exposure. Past treatments include diuretics, ACE inhibitors and central alpha agonists. The current treatment provides mild improvement. There are no compliance problems.      Review of systems  Constitutional: + Minimally fluctuating body weight,  current Body mass index is 28.52 kg/m. , + fatigue, no subjective hyperthermia, no subjective hypothermia Eyes: + blurry vision (has referral to ophthalmology in process from PCP), no xerophthalmia ENT: no sore throat, no nodules palpated in throat, no dysphagia/odynophagia, no hoarseness Cardiovascular: no chest pain, no shortness of breath, no palpitations, no leg swelling Respiratory: no cough, no shortness of breath Gastrointestinal: no nausea/vomiting/diarrhea Musculoskeletal: no muscle/joint aches Skin: no rashes, no hyperemia Neurological: no tremors, no numbness, no tingling, no dizziness Psychiatric: no depression, no anxiety  Objective:    BP (!) 150/100 (BP Location: Left Arm, Patient Position: Sitting)   Pulse (!) 105   Ht 5\' 3"  (1.6 m)   Wt 161 lb (73 kg)   LMP 01/22/2020   BMI 28.52 kg/m   Wt Readings from Last 3 Encounters:  01/31/20 161 lb (73 kg)  01/26/20 159 lb (72.1 kg)  05/12/19 161 lb 0.6 oz  (73 kg)     BP Readings from Last 3 Encounters:  01/31/20 (!) 150/100  01/26/20 116/70  05/12/19 110/80     Physical Exam- Limited  Constitutional:  Body mass index is 28.52 kg/m. , not in acute distress, normal state of mind Eyes:  EOMI, no exophthalmos Neck: Supple Thyroid: No gross goiter Cardiovascular: RRR, no murmers, rubs, or gallops, no edema Respiratory: Adequate breathing efforts, no crackles, rales, rhonchi, or wheezing Musculoskeletal: no gross deformities, strength intact in all four extremities, no gross restriction of joint movements Skin:  no rashes, no hyperemia Neurological: no tremor with outstretched hands   CMP ( most recent) CMP     Component Value Date/Time   NA 133 (L) 03/09/2019 1018   K 4.0 03/09/2019 1018   CL 97 (L) 03/09/2019 1018   CO2 26 03/09/2019 1018   GLUCOSE 414 (H) 03/09/2019 1018   BUN 17 03/09/2019 1018   CREATININE 0.83 03/09/2019 1018   CALCIUM 9.9 03/09/2019 1018   PROT 7.4 03/09/2019 1018   AST 9 (L) 03/09/2019 1018   ALT 7 03/09/2019 1018   BILITOT 0.4 03/09/2019 1018   GFRNONAA 89 03/09/2019 1018   GFRAA 103 03/09/2019 1018     Diabetic Labs (most recent): Lab Results  Component Value Date   HGBA1C 14.0 (A) 01/26/2020   HGBA1C 12.9 (H) 03/09/2019     Lipid Panel ( most recent) Lipid Panel     Component Value Date/Time   CHOL 192 03/09/2019 1018   TRIG 81 03/09/2019 1018   HDL 43 (L) 03/09/2019 1018   CHOLHDL 4.5 03/09/2019 1018   LDLCALC 131 (H) 03/09/2019 1018      Lab Results  Component Value Date   TSH 1.65 03/09/2019           Assessment & Plan:   1) Type 2 diabetes mellitus with hyperglycemia, without long-term current use of insulin (HCC)  She presents today for her initial consultation without any meter or logs.  She does not routinely monitor her glucose at home.  Her previsit A1C was 14%.  She was diagnosed with diabetes in Jan of 2021 incidentally when she went to urgent care for not  feeling well.  Her PCP put her on Metformin 500 mg twice  daily (which she has not been taking due to adverse GI affects) and Glipizide 10 mg XL.  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  She denies any s/s of hypoglycemia, but reports hypoglycemia is a real fear of hers.  - Dawn Benjamin has currently uncontrolled symptomatic type 2 DM since 39 years of age,  with most recent A1c of 14 %. Recent labs reviewed. - I had a long discussion with her about the progressive nature of diabetes and the pathology behind its complications. -her diabetes is complicated by hyperglycemia, HTN, HLD, heavy smoking and she remains at a high risk for more acute and chronic complications which include CAD, CVA, CKD, retinopathy, and neuropathy. These are all discussed in detail with her.  - I have counseled her on diet and weight management  by adopting a carbohydrate restricted/protein rich diet. Patient is encouraged to switch to  unprocessed or minimally processed     complex starch and increased protein intake (animal or plant source), fruits, and vegetables. -  she is advised to stick to a routine mealtimes to eat 3 meals  a day and avoid unnecessary snacks ( to snack only to correct hypoglycemia).   - she admits that there is a room for improvement in her food and drink choices. - Suggestion is made for her to avoid simple carbohydrates  from her diet including Cakes, Sweet Desserts, Ice Cream, Soda (diet and regular), Sweet Tea, Candies, Chips, Cookies, Store Bought Juices, Alcohol in Excess of  1-2 drinks a day, Artificial Sweeteners,  Coffee Creamer, and "Sugar-free" Products. This will help patient to have more stable blood glucose profile and potentially avoid unintended weight gain.  - she will be scheduled with Norm Salt, RDN, CDE for diabetes education.  - I have approached her with the following individualized plan to manage  her diabetes and patient agrees:   - she  will be initiated on basal insulin Lantus 20 units daily at bedtime.  Proper insulin injection technique was discussed with her. -She is encouraged to monitor glucose 4 times daily, before meals and before bed, and bring her meter and logs to her follow up appointment in 2 weeks for review.  - she is warned not to take insulin without proper monitoring per orders. - Adjustment parameters are given to her for hypo and hyperglycemia in writing. - she is encouraged to call clinic for blood glucose levels less than 70 or above 300 mg /dl. - she is advised to continue Glipizide 10 mg XL, therapeutically suitable for patient . - her Metformin will be changed to the ER form to help with her GI side effects.  - she will be considered for incretin therapy as appropriate next visit. Not currently a candidate due to chronic heavy smoking.  - Specific targets for  A1c;  LDL, HDL,  and Triglycerides were discussed with the patient.  2) Blood Pressure /Hypertension:  Her blood pressure is not controlled to target.   she is advised to continue her current medications including Lisinopril-HCT 20-25 mg po daily and clonidine 0.1 mg po daily (these meds recently adjusted by her PCP).  3) Lipids/Hyperlipidemia:    Her most recent lipid panel from 03/09/19 shows uncontrolled LDL of 131.  She is advised to continue Crestor 5 mg po daily at bedtime.  Side effects and precautions discussed with her.  Lipid panel ordered by PCP to be drawn tomorrow.  4)  Weight/Diet: Her Body mass  index is 28.52 kg/m.  -  somewhat complicating her diabetes care.   she is a candidate for some weight loss. I discussed with her the fact that loss of 5 - 10% of her  current body weight will have the most impact on her diabetes management.  Exercise, and detailed carbohydrates information provided  -  detailed on discharge instructions.  5) Chronic Care/Health Maintenance: -she is on ACEI/ARB and Statin medications and  is encouraged to  initiate and continue to follow up with Ophthalmology, Dentist,  Podiatrist at least yearly or according to recommendations, and advised to QUIT smoking. I have recommended yearly flu vaccine and pneumonia vaccine at least every 5 years; moderate intensity exercise for up to 150 minutes weekly; and  sleep for at least 7 hours a day.  - she is advised to maintain close follow up with Freddy Finner, NP for primary care needs, as well as her other providers for optimal and coordinated care.   - Time spent in this patient care: 60 min, of which > 50% was spent in  counseling  her about her diabetes and the rest reviewing her blood glucose logs , discussing her hypoglycemia and hyperglycemia episodes, reviewing her current and  previous labs / studies  ( including abstraction from other facilities) and medications  doses and developing a  long term treatment plan based on the latest standards of care/ guidelines; and documenting her care.    Please refer to Patient Instructions for Blood Glucose Monitoring and Insulin/Medications Dosing Guide"  in media tab for additional information. Please  also refer to " Patient Self Inventory" in the Media  tab for reviewed elements of pertinent patient history.  Delane Ginger participated in the discussions, expressed understanding, and voiced agreement with the above plans.  All questions were answered to her satisfaction. she is encouraged to contact clinic should she have any questions or concerns prior to her return visit.   Follow up plan: - Return in about 2 weeks (around 02/14/2020) for Diabetes follow up, Bring glucometer and logs.  Ronny Bacon, [facility] [address] [place], [place] [address] Phone: [phone] Fax: [phone]  01/31/2020, 3:53 PM

## 2020-02-02 LAB — COMPREHENSIVE METABOLIC PANEL
ALT: 11 IU/L (ref 0–32)
AST: 9 IU/L (ref 0–40)
Albumin/Globulin Ratio: 1.2 (ref 1.2–2.2)
Albumin: 3.7 g/dL — ABNORMAL LOW (ref 3.8–4.8)
Alkaline Phosphatase: 215 IU/L — ABNORMAL HIGH (ref 44–121)
BUN/Creatinine Ratio: 9 (ref 9–23)
BUN: 7 mg/dL (ref 6–24)
Bilirubin Total: 0.3 mg/dL (ref 0.0–1.2)
CO2: 27 mmol/L (ref 20–29)
Calcium: 9.4 mg/dL (ref 8.7–10.2)
Chloride: 95 mmol/L — ABNORMAL LOW (ref 96–106)
Creatinine, Ser: 0.81 mg/dL (ref 0.57–1.00)
GFR calc Af Amer: 105 mL/min/{1.73_m2} (ref >59)
GFR calc non Af Amer: 91 mL/min/{1.73_m2} (ref >59)
Globulin, Total: 3.1 g/dL (ref 1.5–4.5)
Glucose: 388 mg/dL — ABNORMAL HIGH (ref 65–99)
Potassium: 3.6 mmol/L (ref 3.5–5.2)
Sodium: 137 mmol/L (ref 134–144)
Total Protein: 6.8 g/dL (ref 6.0–8.5)

## 2020-02-02 LAB — CBC
Hematocrit: 42.2 % (ref 34.0–46.6)
Hemoglobin: 14.1 g/dL (ref 11.1–15.9)
MCH: 26.6 pg (ref 26.6–33.0)
MCHC: 33.4 g/dL (ref 31.5–35.7)
MCV: 80 fL (ref 79–97)
Platelets: 370 10*3/uL (ref 150–450)
RBC: 5.31 x10E6/uL — ABNORMAL HIGH (ref 3.77–5.28)
RDW: 14.1 % (ref 11.7–15.4)
WBC: 8.3 10*3/uL (ref 3.4–10.8)

## 2020-02-02 LAB — TSH: TSH: 1.43 u[IU]/mL (ref 0.450–4.500)

## 2020-02-02 LAB — LIPID PANEL
Chol/HDL Ratio: 5.6 ratio — ABNORMAL HIGH (ref 0.0–4.4)
Cholesterol, Total: 239 mg/dL — ABNORMAL HIGH (ref 100–199)
HDL: 43 mg/dL (ref >39)
LDL Chol Calc (NIH): 166 mg/dL — ABNORMAL HIGH (ref 0–99)
Triglycerides: 163 mg/dL — ABNORMAL HIGH (ref 0–149)
VLDL Cholesterol Cal: 30 mg/dL (ref 5–40)

## 2020-02-02 LAB — VITAMIN D 25 HYDROXY (VIT D DEFICIENCY, FRACTURES): Vit D, 25-Hydroxy: 10.8 ng/mL — ABNORMAL LOW (ref 30.0–100.0)

## 2020-02-04 ENCOUNTER — Other Ambulatory Visit: Payer: Self-pay | Admitting: Family Medicine

## 2020-02-04 DIAGNOSIS — I10 Essential (primary) hypertension: Secondary | ICD-10-CM

## 2020-02-07 ENCOUNTER — Telehealth: Payer: Self-pay | Admitting: Nurse Practitioner

## 2020-02-07 MED ORDER — LEVEMIR FLEXTOUCH 100 UNIT/ML ~~LOC~~ SOPN: 20.0000 [IU] | PEN_INJECTOR | Freq: Every day | SUBCUTANEOUS | 3 refills | Status: DC

## 2020-02-07 NOTE — Telephone Encounter (Signed)
Pt states she went to pick up insulin today from [facility] and it was over 400. She needs something cheaper called in if possible. Patient did go ahead and get the pen needles and wants to know if she can use those with the alternative.

## 2020-02-07 NOTE — Telephone Encounter (Signed)
I sent in for Levemir (it appears to be covered by her insurance) at the same dose as the Lantus was.

## 2020-02-07 NOTE — Telephone Encounter (Signed)
Please advise 

## 2020-02-14 ENCOUNTER — Ambulatory Visit: Payer: Managed Care, Other (non HMO) | Admitting: Nurse Practitioner

## 2020-02-21 ENCOUNTER — Other Ambulatory Visit: Payer: Self-pay

## 2020-02-21 DIAGNOSIS — I10 Essential (primary) hypertension: Secondary | ICD-10-CM

## 2020-02-21 MED ORDER — CLONIDINE HCL 0.1 MG PO TABS: 0.1000 mg | ORAL_TABLET | Freq: Every day | ORAL | 1 refills | Status: DC

## 2020-04-27 ENCOUNTER — Ambulatory Visit: Payer: Managed Care, Other (non HMO) | Admitting: Family Medicine

## 2020-05-04 ENCOUNTER — Ambulatory Visit: Payer: Managed Care, Other (non HMO) | Admitting: Internal Medicine

## 2020-05-10 ENCOUNTER — Ambulatory Visit: Payer: Managed Care, Other (non HMO) | Admitting: Internal Medicine

## 2020-06-11 ENCOUNTER — Other Ambulatory Visit: Payer: Self-pay

## 2020-06-11 DIAGNOSIS — I10 Essential (primary) hypertension: Secondary | ICD-10-CM

## 2020-06-11 MED ORDER — CLONIDINE HCL 0.1 MG PO TABS: 0.1000 mg | ORAL_TABLET | Freq: Every day | ORAL | 0 refills | Status: DC

## 2020-07-25 ENCOUNTER — Encounter (HOSPITAL_COMMUNITY): Payer: Self-pay | Admitting: Emergency Medicine

## 2020-07-25 ENCOUNTER — Other Ambulatory Visit: Payer: Self-pay

## 2020-07-25 ENCOUNTER — Emergency Department (HOSPITAL_COMMUNITY)
Admission: EM | Admit: 2020-07-25 | Discharge: 2020-07-25 | Disposition: A | Discharge status: Home / Self Care | Payer: Managed Care, Other (non HMO) | Attending: Emergency Medicine | Admitting: Emergency Medicine

## 2020-07-25 DIAGNOSIS — E1165 Type 2 diabetes mellitus with hyperglycemia: Secondary | ICD-10-CM | POA: Diagnosis not present

## 2020-07-25 DIAGNOSIS — Z7984 Long term (current) use of oral hypoglycemic drugs: Secondary | ICD-10-CM | POA: Insufficient documentation

## 2020-07-25 DIAGNOSIS — Z79899 Other long term (current) drug therapy: Secondary | ICD-10-CM | POA: Diagnosis not present

## 2020-07-25 DIAGNOSIS — I10 Essential (primary) hypertension: Secondary | ICD-10-CM | POA: Insufficient documentation

## 2020-07-25 DIAGNOSIS — Z794 Long term (current) use of insulin: Secondary | ICD-10-CM | POA: Diagnosis not present

## 2020-07-25 DIAGNOSIS — F17210 Nicotine dependence, cigarettes, uncomplicated: Secondary | ICD-10-CM | POA: Diagnosis not present

## 2020-07-25 DIAGNOSIS — R739 Hyperglycemia, unspecified: Secondary | ICD-10-CM

## 2020-07-25 DIAGNOSIS — H538 Other visual disturbances: Secondary | ICD-10-CM | POA: Diagnosis present

## 2020-07-25 DIAGNOSIS — E876 Hypokalemia: Secondary | ICD-10-CM | POA: Diagnosis not present

## 2020-07-25 LAB — BASIC METABOLIC PANEL
Anion gap: 10 (ref 5–15)
BUN: 9 mg/dL (ref 6–20)
CO2: 30 mmol/L (ref 22–32)
Calcium: 8.5 mg/dL — ABNORMAL LOW (ref 8.9–10.3)
Chloride: 90 mmol/L — ABNORMAL LOW (ref 98–111)
Creatinine, Ser: 0.75 mg/dL (ref 0.44–1.00)
GFR, Estimated: >60 mL/min (ref >60)
Glucose, Bld: 455 mg/dL — ABNORMAL HIGH (ref 70–99)
Potassium: 2.8 mmol/L — ABNORMAL LOW (ref 3.5–5.1)
Sodium: 130 mmol/L — ABNORMAL LOW (ref 135–145)

## 2020-07-25 LAB — URINALYSIS, ROUTINE W REFLEX MICROSCOPIC
Bacteria, UA: NONE SEEN
Bilirubin Urine: NEGATIVE
Glucose, UA: >=500 mg/dL — AB
Ketones, ur: NEGATIVE mg/dL
Leukocytes,Ua: NEGATIVE
Nitrite: NEGATIVE
Protein, ur: 100 mg/dL — AB
Specific Gravity, Urine: 1.02 (ref 1.005–1.030)
pH: 6 (ref 5.0–8.0)

## 2020-07-25 LAB — CBC
HCT: 36 % (ref 36.0–46.0)
Hemoglobin: 12 g/dL (ref 12.0–15.0)
MCH: 25.6 pg — ABNORMAL LOW (ref 26.0–34.0)
MCHC: 33.3 g/dL (ref 30.0–36.0)
MCV: 76.8 fL — ABNORMAL LOW (ref 80.0–100.0)
Platelets: 386 10*3/uL (ref 150–400)
RBC: 4.69 MIL/uL (ref 3.87–5.11)
RDW: 13.4 % (ref 11.5–15.5)
WBC: 7.5 10*3/uL (ref 4.0–10.5)
nRBC: 0 % (ref 0.0–0.2)

## 2020-07-25 LAB — PREGNANCY, URINE: Preg Test, Ur: NEGATIVE

## 2020-07-25 LAB — CBG MONITORING, ED
Glucose-Capillary: 450 mg/dL — ABNORMAL HIGH (ref 70–99)
Glucose-Capillary: 398 mg/dL — ABNORMAL HIGH (ref 70–99)
Glucose-Capillary: 272 mg/dL — ABNORMAL HIGH (ref 70–99)

## 2020-07-25 MED ORDER — NOVOLIN 70/30 FLEXPEN RELION (70-30) 100 UNIT/ML ~~LOC~~ SUPN: 15.0000 [IU] | PEN_INJECTOR | Freq: Two times a day (BID) | SUBCUTANEOUS | 11 refills | Status: DC

## 2020-07-25 MED ORDER — SODIUM CHLORIDE 0.9 % IV BOLUS
1000.0000 mL | Freq: Once | INTRAVENOUS | Status: AC
  Administered 2020-07-25: 1000 mL via INTRAVENOUS

## 2020-07-25 MED ORDER — POTASSIUM CHLORIDE ER 10 MEQ PO TBCR: 10.0000 meq | EXTENDED_RELEASE_TABLET | Freq: Two times a day (BID) | ORAL | 0 refills | Status: DC

## 2020-07-25 MED ORDER — INSULIN ASPART 100 UNIT/ML ~~LOC~~ SOLN
4.0000 [IU] | Freq: Once | SUBCUTANEOUS | Status: AC
  Administered 2020-07-25: 4 [IU] via INTRAVENOUS
  Filled 2020-07-25: qty 1

## 2020-07-25 MED ORDER — INSULIN PEN NEEDLE 32G X 4 MM MISC: 11 refills | Status: DC

## 2020-07-25 MED ORDER — LACTATED RINGERS IV BOLUS
1000.0000 mL | Freq: Once | INTRAVENOUS | Status: AC
  Administered 2020-07-25: 1000 mL via INTRAVENOUS

## 2020-07-25 MED ORDER — POTASSIUM CHLORIDE 10 MEQ/100ML IV SOLN
10.0000 meq | Freq: Once | INTRAVENOUS | Status: AC
  Administered 2020-07-25: 10 meq via INTRAVENOUS
  Filled 2020-07-25: qty 100

## 2020-07-25 NOTE — Progress Notes (Signed)
Inpatient Diabetes Program Recommendations  AACE/ADA: New Consensus Statement on Inpatient Glycemic Control (2015)  Target Ranges:  Prepandial:   less than 140 mg/dL      Peak postprandial:   less than 180 mg/dL (1-2 hours)      Critically ill patients:  140 - 180 mg/dL   Lab Results  Component Value Date   GLUCAP 398 (H) 07/25/2020   HGBA1C 14.0 (A) 01/26/2020    Review of Glycemic Control  Diabetes history: DM 2 Outpatient Diabetes medications: Metformin 500 mg Daily (pt having diarrhea) Current orders for Inpatient glycemic control:  Being evaluated in ED  A1c 14% on 01/26/20  Inpatient Diabetes Program Recommendations:    -  Consider 70/30 15 units bid   Insulin at [facility] ReliOn brand over the counter ($25 vial not including syringes, $43 a box of 5 insulin pens without the pen needles). Pen needles for a box of 50 $9, Syringes box of 100 $12.58   ReliOn 70/30 insulin pen order # [id number] Insulin pen needles order # [id number]  Thanks,  Christena Deem RN, MSN, BC-ADM Inpatient Diabetes Coordinator Team Pager [phone] (8a-5p)

## 2020-07-25 NOTE — ED Notes (Signed)
Patient denies pain and is resting comfortably.  

## 2020-07-25 NOTE — ED Triage Notes (Signed)
Pt states went into work yesterday at 1600 and noticed her vision was blurred. Progressively gotten worse and woke up today and her vision is cloudy.

## 2020-07-25 NOTE — Discharge Instructions (Addendum)
Glucose Products:  ReliOnT glucose products raise low blood sugar fast. Tablets are free of fat, caffeine, sodium and gluten. They are portable and easy to carry, making it easier for people with diabetes to BE PREPARED for lows.  Glucose Tablets Available in 6 flavors . 10 ct...................................... $1.00 . 50 ct...................................... $3.98 Glucose Shot..................................$1.48 Glucose Gel....................................$3.44  Alcohol Swabs Alcohol swabs are used to sterilize your injection site. All of our swabs are individually wrapped for maximum safety, convenience and moisture retention. ReliOnT Alcohol Swabs . 100 ct Swabs..............................$1.00 . 400 ct Swabs..............................$3.74  Lancets ReliOnT offers three lancet options conveniently designed to work with almost every lancing device. Each features a protective disk, which guarantees sterility before testing. ReliOnT Lancets . 100 ct Lancets $1.56 . 200 ct Lancets $2.64 Available in Ultra-Thin, Thin & Micro-Thin ReliOnT 2-IN-1 Lancing Device . 50 ct Lancets..................................... $3.44 Available in 30 gauge and 25 gauge ReliOnT Lancing Device....................$5.84  Blood Glucose Monitors ReliOnT offers a full range of blood glucose testing options to provide an accurate, affordable system that meets each person's unique needs and preferences. Prime Meter....................................... $9.00 Prime Test Strips . 25 test strips.................................... $5.00 . 50 test strips.................................... $9.00 . 100 test strips.................................$17.88 Premier BLU Meter  ............  $18.98 Premier Voice Meter  .............  $14.98 Premier Test Strips . 50 test strips.................................... $9.00 . 100 test strips.................................$17.88 Premier [employer]   ............  $19.44 Kit includes: . 50 test strips . 10 lancets . Lancing device . Carry case  Ketone Test Strips . 50 test strips  ................  $6.64  Human Insulin  Novolin/ReliOnT (recombinant DNA origin) is manufactured for [employer] by [employer] Insulin* with Vial..........$24.88 Available in N, R, 70/30 Novolin/ReliOnT Insulin Pens*  .....  $42.88 Available in N, R, 70/30  Insulin Delivery ReliOnT syringes and pen needles provide precision technology, comfort and accuracy in insulin delivery at affordable prices. ReliOnT Pen Needles* . 50 ct....................................................$9.00 Available in 53m, 667m 63m20m 71m72mliOnT Insulin Syringes* . 100 ct ............ $12.58 Available in 29G, 30G & 31G (3/10cc, 1/2cc & 1cc units)

## 2020-07-25 NOTE — ED Provider Notes (Signed)
[facility] EMERGENCY DEPARTMENT Provider Note   CSN: [id number] Arrival date & time: 07/25/20  0815     History Chief Complaint  Patient presents with  . Blurred Vision    Dawn Benjamin is a 40 y.o. female.  HPI Patient presents with blurred vision.  States that started yesterday.  Blurred vision both of her eyes worse today.  Worse on left compared to right eye.  Eyes does look a little foggy.  No headache.  No confusion.  No chest pain.  Upon arrival found to have sugar of 450.  Has not had her metformin in 4 days now.  States she did not like it because it was giving her diarrhea.  They were trying to get her onto some insulin but states it cost $2000 and she was unable to get it.  No headache.  No confusion.  States she feels pretty good otherwise.  States she slept pretty well last night did not have to urinate too much    Past Medical History:  Diagnosis Date  . Allergy   . Anxiety   . Diabetes mellitus without complication (HCC)   . Hypertension   . Menorrhagia 05/09/2019   Regular cycles    Patient Active Problem List   Diagnosis Date Noted  . Annual visit for general adult medical examination with abnormal findings 01/26/2020  . Mixed hyperlipidemia 01/26/2020  . Vitamin D deficiency 01/26/2020  . Encounter for screening mammogram for malignant neoplasm of breast 01/26/2020  . Overweight (BMI 25.0-29.9) 05/12/2019  . Hypertension 04/07/2019  . Cigarette nicotine dependence without complication 04/07/2019  . Type 2 diabetes mellitus with hyperglycemia (HCC) 04/07/2019    Past Surgical History:  Procedure Laterality Date  . CHOLECYSTECTOMY    . DENTAL SURGERY       OB History    Gravida  1   Para  1   Term  1   Preterm  0   AB  0   Living  0     SAB  0   IAB  0   Ectopic  0   Multiple  0   Live Births  0           Family History  Problem Relation Age of Onset  . Hypertension Mother   . Diabetes Mother   . Cancer Mother         colon  . Diabetes Paternal Aunt   . Hypertension Paternal Aunt     Social History   Tobacco Use  . Smoking status: Current Every Day Smoker    Packs/day: 0.10    Types: Cigarettes  . Smokeless tobacco: Never Used  Vaping Use  . Vaping Use: Never used  Substance Use Topics  . Alcohol use: Yes  . Drug use: No    Home Medications Prior to Admission medications   Medication Sig Start Date End Date Taking? Authorizing Provider  cloNIDine (CATAPRES) 0.1 MG tablet Take 1 tablet (0.1 mg total) by mouth daily. 06/11/20  Yes Freddy Finner, NP  insulin isophane & regular human (NOVOLIN 70/30 FLEXPEN RELION) (70-30) 100 UNIT/ML KwikPen Inject 15 Units into the skin 2 (two) times daily. 07/25/20  Yes Benjiman Core, MD  Insulin Pen Needle 32G X 4 MM MISC Use with insulin twice daily 07/25/20  Yes Benjiman Core, MD  lisinopril-hydrochlorothiazide (ZESTORETIC) 20-25 MG tablet Take 1 tablet by mouth daily. 09/05/19  Yes Freddy Finner, NP  metFORMIN (GLUCOPHAGE XR) 500 MG 24 hr tablet Take  1 tablet (500 mg total) by mouth daily with breakfast. 01/31/20  Yes Reardon, Freddi Starr, NP  potassium chloride (KLOR-CON) 10 MEQ tablet Take 1 tablet (10 mEq total) by mouth 2 (two) times daily. 07/25/20  Yes Benjiman Core, MD  Ferrous Sulfate (IRON) 325 (65 Fe) MG TABS Take 1 tablet (325 mg total) by mouth 2 (two) times daily with a meal. Patient not taking: Reported on 07/25/2020 03/10/19   Freddy Finner, NP  glipiZIDE (GLUCOTROL XL) 10 MG 24 hr tablet Take 1 tablet (10 mg total) by mouth daily with breakfast. Patient not taking: Reported on 07/25/2020 04/07/19   Freddy Finner, NP  insulin detemir (LEVEMIR FLEXTOUCH) 100 UNIT/ML FlexPen Inject 20 Units into the skin at bedtime. Patient not taking: Reported on 07/25/2020 02/07/20   Dani Gobble, NP  rosuvastatin (CRESTOR) 5 MG tablet Take 1 tablet (5 mg total) by mouth daily. Patient not taking: Reported on 07/25/2020 03/10/19   Freddy Finner,  NP    Allergies    Patient has no known allergies.  Review of Systems   Review of Systems  Constitutional: Negative for appetite change.  HENT: Negative for congestion.   Eyes: Positive for visual disturbance. Negative for discharge and itching.  Respiratory: Negative for shortness of breath.   Cardiovascular: Negative for chest pain.  Gastrointestinal: Negative for abdominal pain.  Endocrine: Negative for polyuria.  Genitourinary: Negative for dyspareunia.  Musculoskeletal: Negative for arthralgias.  Skin: Negative for rash.  Neurological: Negative for headaches.  Psychiatric/Behavioral: Negative for confusion.    Physical Exam Updated Vital Signs BP (!) 142/103   Pulse 92   Temp 98.1 F (36.7 C) (Oral)   Resp 16   Ht 5\' 2"  (1.575 m)   Wt 70.8 kg   SpO2 100%   BMI 28.53 kg/m   Physical Exam Vitals and nursing note reviewed.  HENT:     Head: Atraumatic.     Mouth/Throat:     Mouth: Mucous membranes are moist.  Eyes:     Extraocular Movements: Extraocular movements intact.     Pupils: Pupils are equal, round, and reactive to light.  Cardiovascular:     Rate and Rhythm: Regular rhythm.  Pulmonary:     Breath sounds: No wheezing or rhonchi.  Abdominal:     Tenderness: There is no abdominal tenderness.  Musculoskeletal:     Cervical back: Neck supple.     Right lower leg: No edema.     Left lower leg: No edema.  Skin:    Capillary Refill: Capillary refill takes less than 2 seconds.  Neurological:     Mental Status: She is alert and oriented to person, place, and time.     ED Results / Procedures / Treatments   Labs (all labs ordered are listed, but only abnormal results are displayed) Labs Reviewed  BASIC METABOLIC PANEL - Abnormal; Notable for the following components:      Result Value   Sodium 130 (*)    Potassium 2.8 (*)    Chloride 90 (*)    Glucose, Bld 455 (*)    Calcium 8.5 (*)    All other components within normal limits  CBC - Abnormal;  Notable for the following components:   MCV 76.8 (*)    MCH 25.6 (*)    All other components within normal limits  URINALYSIS, ROUTINE W REFLEX MICROSCOPIC - Abnormal; Notable for the following components:   Color, Urine STRAW (*)    Glucose, UA >=500 (*)  Hgb urine dipstick LARGE (*)    Protein, ur 100 (*)    All other components within normal limits  CBG MONITORING, ED - Abnormal; Notable for the following components:   Glucose-Capillary 450 (*)    All other components within normal limits  CBG MONITORING, ED - Abnormal; Notable for the following components:   Glucose-Capillary 398 (*)    All other components within normal limits  CBG MONITORING, ED - Abnormal; Notable for the following components:   Glucose-Capillary 272 (*)    All other components within normal limits  PREGNANCY, URINE    EKG None  Radiology No results found.  Procedures Procedures   Medications Ordered in ED Medications  sodium chloride 0.9 % bolus 1,000 mL (0 mLs Intravenous Stopped 07/25/20 1014)  potassium chloride 10 mEq in 100 mL IVPB (0 mEq Intravenous Stopped 07/25/20 1224)  insulin aspart (novoLOG) injection 4 Units (4 Units Intravenous Given 07/25/20 1134)  lactated ringers bolus 1,000 mL (0 mLs Intravenous Stopped 07/25/20 1354)    ED Course  I have reviewed the triage vital signs and the nursing notes.  Pertinent labs & imaging results that were available during my care of the patient were reviewed by me and considered in my medical decision making (see chart for details).    MDM Rules/Calculators/A&P                          Patient with hyperglycemia and vision changes.  Had noncompliance with her metformin.  I think the vision changes are likely secondary to hyperglycemia.  Improved somewhat with treatment.  Had been given fluids and then insulin.  Also found to be hypokalemic.  Not in DKA.  Had issues affording insulin and has not on the nighttime insulin either. Diabetic educator  has seen and made some recommendations the patient should be able to afford.  I have written for these.  We will follow-up as an outpatient.  Also will give potassium since she was hypokalemic.  Doubt stroke.  Discharge home. Final Clinical Impression(s) / ED Diagnoses Final diagnoses:  Hyperglycemia  Hypokalemia    Rx / DC Orders ED Discharge Orders         Ordered    insulin isophane & regular human (NOVOLIN 70/30 FLEXPEN RELION) (70-30) 100 UNIT/ML KwikPen  2 times daily        07/25/20 1436    Insulin Pen Needle 32G X 4 MM MISC        07/25/20 1436    potassium chloride (KLOR-CON) 10 MEQ tablet  2 times daily        07/25/20 1438           Benjiman Core, MD 07/25/20 1558

## 2020-07-25 NOTE — ED Notes (Signed)
Ambulated to BR with no issues 

## 2020-09-12 DIAGNOSIS — E113523 Type 2 diabetes mellitus with proliferative diabetic retinopathy with traction retinal detachment involving the macula, bilateral: Secondary | ICD-10-CM | POA: Insufficient documentation

## 2020-12-14 ENCOUNTER — Other Ambulatory Visit (HOSPITAL_COMMUNITY): Payer: Self-pay

## 2021-01-17 ENCOUNTER — Other Ambulatory Visit: Payer: Self-pay

## 2021-01-17 ENCOUNTER — Encounter (HOSPITAL_COMMUNITY): Payer: Self-pay | Admitting: Emergency Medicine

## 2021-01-17 ENCOUNTER — Emergency Department (HOSPITAL_COMMUNITY)
Admission: EM | Admit: 2021-01-17 | Discharge: 2021-01-17 | Disposition: A | Discharge status: Home / Self Care | Payer: Managed Care, Other (non HMO) | Attending: Emergency Medicine | Admitting: Emergency Medicine

## 2021-01-17 DIAGNOSIS — Z79899 Other long term (current) drug therapy: Secondary | ICD-10-CM | POA: Diagnosis not present

## 2021-01-17 DIAGNOSIS — F17210 Nicotine dependence, cigarettes, uncomplicated: Secondary | ICD-10-CM | POA: Diagnosis not present

## 2021-01-17 DIAGNOSIS — E119 Type 2 diabetes mellitus without complications: Secondary | ICD-10-CM | POA: Diagnosis not present

## 2021-01-17 DIAGNOSIS — I10 Essential (primary) hypertension: Secondary | ICD-10-CM | POA: Diagnosis present

## 2021-01-17 DIAGNOSIS — Z794 Long term (current) use of insulin: Secondary | ICD-10-CM | POA: Diagnosis not present

## 2021-01-17 DIAGNOSIS — Z7984 Long term (current) use of oral hypoglycemic drugs: Secondary | ICD-10-CM | POA: Insufficient documentation

## 2021-01-17 MED ORDER — LISINOPRIL 10 MG PO TABS
20.0000 mg | ORAL_TABLET | Freq: Once | ORAL | Status: AC
  Administered 2021-01-17: 20 mg via ORAL
  Filled 2021-01-17: qty 2

## 2021-01-17 MED ORDER — CLONIDINE HCL 0.1 MG PO TABS
0.1000 mg | ORAL_TABLET | Freq: Once | ORAL | Status: AC
  Administered 2021-01-17: 0.1 mg via ORAL
  Filled 2021-01-17: qty 1

## 2021-01-17 MED ORDER — HYDROCHLOROTHIAZIDE 25 MG PO TABS
25.0000 mg | ORAL_TABLET | Freq: Every day | ORAL | Status: DC
  Administered 2021-01-17: 25 mg via ORAL
  Filled 2021-01-17: qty 1

## 2021-01-17 MED ORDER — LISINOPRIL-HYDROCHLOROTHIAZIDE 20-25 MG PO TABS: 1.0000 | ORAL_TABLET | Freq: Every day | ORAL | 3 refills | Status: DC

## 2021-01-17 NOTE — Discharge Instructions (Addendum)
Follow-up with your doctor next week for recheck 

## 2021-01-17 NOTE — ED Triage Notes (Signed)
Patient states she went to health dept for her diabetic medication when they sent her here for further evaluation of hypertension. States she has been out of her lisinopril since early September but has continued taking her clonidine. Reports feeling dizzy and off balance for over the past week.

## 2021-01-17 NOTE — ED Provider Notes (Signed)
[facility] EMERGENCY DEPARTMENT Provider Note   CSN: [id number] Arrival date & time: 01/17/21  0930     History Chief Complaint  Patient presents with   Hypertension    Dawn Benjamin is a 40 y.o. female.  Patient was sent to the emergency department because her blood pressure was significantly high in the office.  She has not been taking her Zestoretic because she ran out of it.  The history is provided by the patient and medical records. No language interpreter was used.  Hypertension This is a recurrent problem. The current episode started more than 2 days ago. The problem occurs constantly. The problem has not changed since onset.Pertinent negatives include no chest pain, no abdominal pain and no headaches. Nothing aggravates the symptoms. She has tried nothing for the symptoms. The treatment provided no relief.      Past Medical History:  Diagnosis Date   Allergy    Anxiety    Diabetes mellitus without complication (HCC)    Hypertension    Menorrhagia 05/09/2019   Regular cycles    Patient Active Problem List   Diagnosis Date Noted   Annual visit for general adult medical examination with abnormal findings 01/26/2020   Mixed hyperlipidemia 01/26/2020   Vitamin D deficiency 01/26/2020   Encounter for screening mammogram for malignant neoplasm of breast 01/26/2020   Overweight (BMI 25.0-29.9) 05/12/2019   Hypertension 04/07/2019   Cigarette nicotine dependence without complication 04/07/2019   Type 2 diabetes mellitus with hyperglycemia (HCC) 04/07/2019    Past Surgical History:  Procedure Laterality Date   CHOLECYSTECTOMY     DENTAL SURGERY       OB History     Gravida  1   Para  1   Term  1   Preterm  0   AB  0   Living  0      SAB  0   IAB  0   Ectopic  0   Multiple  0   Live Births  0           Family History  Problem Relation Age of Onset   Hypertension Mother    Diabetes Mother    Cancer Mother        colon    Diabetes Paternal Aunt    Hypertension Paternal Aunt     Social History   Tobacco Use   Smoking status: Every Day    Packs/day: 0.10    Types: Cigarettes   Smokeless tobacco: Never  Vaping Use   Vaping Use: Never used  Substance Use Topics   Alcohol use: Yes   Drug use: No    Home Medications Prior to Admission medications   Medication Sig Start Date End Date Taking? Authorizing Provider  cloNIDine (CATAPRES) 0.1 MG tablet Take 1 tablet (0.1 mg total) by mouth daily. 06/11/20  Yes Freddy Finner, NP  glipiZIDE (GLUCOTROL XL) 10 MG 24 hr tablet Take 1 tablet (10 mg total) by mouth daily with breakfast. 04/07/19  Yes Freddy Finner, NP  insulin isophane & regular human (NOVOLIN 70/30 FLEXPEN RELION) (70-30) 100 UNIT/ML KwikPen Inject 15 Units into the skin 2 (two) times daily. 07/25/20  Yes Benjiman Core, MD  lisinopril-hydrochlorothiazide (ZESTORETIC) 20-25 MG tablet Take 1 tablet by mouth daily. 01/17/21  Yes Bethann Berkshire, MD  metFORMIN (GLUCOPHAGE XR) 500 MG 24 hr tablet Take 1 tablet (500 mg total) by mouth daily with breakfast. 01/31/20  Yes Dani Gobble, NP  Ferrous Sulfate (  IRON) 325 (65 Fe) MG TABS Take 1 tablet (325 mg total) by mouth 2 (two) times daily with a meal. Patient not taking: No sig reported 03/10/19   Freddy Finner, NP  insulin detemir (LEVEMIR FLEXTOUCH) 100 UNIT/ML FlexPen Inject 20 Units into the skin at bedtime. Patient not taking: No sig reported 02/07/20   Dani Gobble, NP  potassium chloride (KLOR-CON) 10 MEQ tablet Take 1 tablet (10 mEq total) by mouth 2 (two) times daily. Patient not taking: No sig reported 07/25/20   Benjiman Core, MD  rosuvastatin (CRESTOR) 5 MG tablet Take 1 tablet (5 mg total) by mouth daily. Patient not taking: No sig reported 03/10/19   Freddy Finner, NP    Allergies    Patient has no known allergies.  Review of Systems   Review of Systems  Constitutional:  Negative for appetite change and fatigue.   HENT:  Negative for congestion, ear discharge and sinus pressure.   Eyes:  Negative for discharge.  Respiratory:  Negative for cough.   Cardiovascular:  Negative for chest pain.  Gastrointestinal:  Negative for abdominal pain and diarrhea.  Genitourinary:  Negative for frequency and hematuria.  Musculoskeletal:  Negative for back pain.  Skin:  Negative for rash.  Neurological:  Negative for seizures and headaches.  Psychiatric/Behavioral:  Negative for hallucinations.    Physical Exam Updated Vital Signs BP (!) 134/91   Pulse 92   Temp 98.1 F (36.7 C) (Oral)   Resp 14   Ht 5\' 4"  (1.626 m)   Wt 61.2 kg   LMP 12/24/2020   SpO2 100%   BMI 23.17 kg/m   Physical Exam Vitals and nursing note reviewed.  Constitutional:      Appearance: She is well-developed.  HENT:     Head: Normocephalic.     Nose: Nose normal.  Eyes:     General: No scleral icterus.    Conjunctiva/sclera: Conjunctivae normal.  Neck:     Thyroid: No thyromegaly.  Cardiovascular:     Rate and Rhythm: Normal rate and regular rhythm.     Heart sounds: No murmur heard.   No friction rub. No gallop.  Pulmonary:     Breath sounds: No stridor. No wheezing or rales.  Chest:     Chest wall: No tenderness.  Abdominal:     General: There is no distension.     Tenderness: There is no abdominal tenderness. There is no rebound.  Musculoskeletal:        General: Normal range of motion.     Cervical back: Neck supple.  Lymphadenopathy:     Cervical: No cervical adenopathy.  Skin:    Findings: No erythema or rash.  Neurological:     Mental Status: She is alert and oriented to person, place, and time.     Motor: No abnormal muscle tone.     Coordination: Coordination normal.  Psychiatric:        Behavior: Behavior normal.    ED Results / Procedures / Treatments   Labs (all labs ordered are listed, but only abnormal results are displayed) Labs Reviewed - No data to display  EKG None  Radiology No  results found.  Procedures Procedures   Medications Ordered in ED Medications  hydrochlorothiazide (HYDRODIURIL) tablet 25 mg (25 mg Oral Given 01/17/21 1021)  lisinopril (ZESTRIL) tablet 20 mg (20 mg Oral Given 01/17/21 1021)  cloNIDine (CATAPRES) tablet 0.1 mg (0.1 mg Oral Given 01/17/21 1027)    ED Course  I  have reviewed the triage vital signs and the nursing notes.  Pertinent labs & imaging results that were available during my care of the patient were reviewed by me and considered in my medical decision making (see chart for details).  Patient with high blood pressure.  She was given an extra clonidine and also started back on her Zestoretic.  Her blood pressure improved. MDM Rules/Calculators/A&P                           Patient with uncontrolled hypertension.  She is started back on her Zestoretic and will follow up with PCP Final Clinical Impression(s) / ED Diagnoses Final diagnoses:  Primary hypertension    Rx / DC Orders ED Discharge Orders          Ordered    lisinopril-hydrochlorothiazide (ZESTORETIC) 20-25 MG tablet  Daily        01/17/21 1236             Bethann Berkshire, MD 01/19/21 1023

## 2021-01-17 NOTE — ED Notes (Signed)
Attempted IV x2. 

## 2021-01-30 ENCOUNTER — Telehealth: Payer: Self-pay | Admitting: Nurse Practitioner

## 2021-01-30 NOTE — Telephone Encounter (Signed)
Received medical record request from [facility].

## 2023-02-03 ENCOUNTER — Encounter: Payer: Self-pay | Admitting: Family Medicine

## 2023-02-03 ENCOUNTER — Ambulatory Visit (INDEPENDENT_AMBULATORY_CARE_PROVIDER_SITE_OTHER): Payer: 59 | Admitting: Family Medicine

## 2023-02-03 VITALS — BP 140/87 | HR 100 | Ht 64.0 in | Wt 171.0 lb

## 2023-02-03 DIAGNOSIS — I10 Essential (primary) hypertension: Secondary | ICD-10-CM | POA: Diagnosis not present

## 2023-02-03 DIAGNOSIS — Z794 Long term (current) use of insulin: Secondary | ICD-10-CM

## 2023-02-03 DIAGNOSIS — Z7984 Long term (current) use of oral hypoglycemic drugs: Secondary | ICD-10-CM

## 2023-02-03 DIAGNOSIS — E038 Other specified hypothyroidism: Secondary | ICD-10-CM | POA: Diagnosis not present

## 2023-02-03 DIAGNOSIS — E782 Mixed hyperlipidemia: Secondary | ICD-10-CM | POA: Diagnosis not present

## 2023-02-03 DIAGNOSIS — Z114 Encounter for screening for human immunodeficiency virus [HIV]: Secondary | ICD-10-CM

## 2023-02-03 DIAGNOSIS — Z1159 Encounter for screening for other viral diseases: Secondary | ICD-10-CM

## 2023-02-03 DIAGNOSIS — E559 Vitamin D deficiency, unspecified: Secondary | ICD-10-CM | POA: Diagnosis not present

## 2023-02-03 DIAGNOSIS — E7849 Other hyperlipidemia: Secondary | ICD-10-CM

## 2023-02-03 DIAGNOSIS — E1165 Type 2 diabetes mellitus with hyperglycemia: Secondary | ICD-10-CM

## 2023-02-03 MED ORDER — OLMESARTAN MEDOXOMIL-HCTZ 40-12.5 MG PO TABS: 1.0000 | ORAL_TABLET | Freq: Every day | ORAL | 0 refills | Status: DC

## 2023-02-03 NOTE — Progress Notes (Signed)
New Patient Office Visit  Subjective:  Patient ID: Dawn Benjamin, female    DOB: 1980/06/30  Age: 42 y.o. MRN: [id number]  CC:  Chief Complaint  Patient presents with   Establish Care    Concerned over B/P. Would like to change medication back to amlodipine.     HPI Dawn Benjamin is a 42 y.o. female with past medical history of hypertension, type 2 diabetes, hyperlipidemia. presents for establishing care.  Hypertension: The patient has been taking lisinopril-hydrochlorothiazide 20-25 mg daily, hydralazine 25 mg three times daily, and labetalol 400 mg daily. She reports compliance with her treatment regimen and is currently asymptomatic in the clinic.  Type 2 Diabetes: The patient reports no polyuria, polyphagia, or polydipsia. She takes Humalog 75/25 at 20 units twice daily, metformin 500 mg twice daily, and glipizide 5 mg twice daily, and she reports compliance with her treatment regimen. 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 She is followed by the ophthalmology team at [facility], with her last follow-up on December 30, 2022, and subsequent annual follow-ups planned.  Hyperlipidemia: The patient currently takes rosuvastatin 5 mg daily.   Past Medical History:  Diagnosis Date   Allergy    Anxiety    Diabetes mellitus without complication (HCC)    Hypertension    Menorrhagia 05/09/2019   Regular cycles    Past Surgical History:  Procedure Laterality Date   CHOLECYSTECTOMY     DENTAL SURGERY      Family History  Problem Relation Age of Onset   Hypertension Mother    Diabetes Mother    Cancer Mother        colon   Diabetes Paternal Aunt    Hypertension Paternal Aunt     Social History   Socioeconomic History   Marital status: Single    Spouse name: Not on file   Number of children: 1   Years of education: Not on file   Highest education level: 12th grade  Occupational History    Occupation: CNA     Comment: [employer]  Tobacco Use   Smoking status: Every Day    Current packs/day: 0.10    Types: Cigarettes   Smokeless tobacco: Never  Vaping Use   Vaping status: Never Used  Substance and Sexual Activity   Alcohol use: Yes   Drug use: No   Sexual activity: Yes    Birth control/protection: Condom  Other Topics Concern   Not on file  Social History Narrative   Lives with son and boyfriend   Son-Dawn Benjamin 22 this month 03/2019      Enjoys: shopping and spending time with girlfriends      Diet: was eating fried foods, but is working to [employer]- does like veggies and fruits   Caffeine: pepsi-20 oz; some sweet tea-weekly    Water: 3-4 bottles daily of 8 oz      Wears seat belt   Smoke detectors at home   Does not use phone while driving      Social Determinants of Health   Financial Resource Strain: Low Risk  (01/26/2020)   Overall Financial Resource Strain (CARDIA)    Difficulty of Paying Living Expenses: Not hard at all  Food Insecurity: No Food Insecurity (01/26/2020)   Hunger Vital Sign    Worried About Running Out of Food in the Last Year: Never true    Ran Out of Food in  the Last Year: Never true  Transportation Needs: No Transportation Needs (01/26/2020)   PRAPARE - Administrator, Civil Service (Medical): No    Lack of Transportation (Non-Medical): No  Physical Activity: Insufficiently Active (01/26/2020)   Exercise Vital Sign    Days of Exercise per Week: 3 days    Minutes of Exercise per Session: 20 min  Stress: No Stress Concern Present (01/26/2020)   [employer] of Occupational Health - Occupational Stress Questionnaire    Feeling of Stress : Not at all  Social Connections: Socially Isolated (01/26/2020)   Social Connection and Isolation Panel [NHANES]    Frequency of Communication with Friends and Family: More than three times a week    Frequency of Social Gatherings with Friends and Family: Three times a week     Attends Religious Services: Never    Active Member of Clubs or Organizations: No    Attends Banker Meetings: Never    Marital Status: Never married  Intimate Partner Violence: Not At Risk (01/26/2020)   Humiliation, Afraid, Rape, and Kick questionnaire    Fear of Current or Ex-Partner: No    Emotionally Abused: No    Physically Abused: No    Sexually Abused: No    ROS Review of Systems  Constitutional:  Negative for chills and fever.  Eyes:  Negative for visual disturbance.  Respiratory:  Negative for chest tightness and shortness of breath.   Neurological:  Negative for dizziness and headaches.    Objective:   Today's Vitals: BP (!) 140/87   Pulse 100   Ht 5\' 4"  (1.626 m)   Wt 171 lb (77.6 kg)   LMP 02/01/2023 (Approximate)   SpO2 98%   BMI 29.35 kg/m   Physical Exam HENT:     Head: Normocephalic.     Mouth/Throat:     Mouth: Mucous membranes are moist.  Cardiovascular:     Rate and Rhythm: Normal rate.     Heart sounds: Normal heart sounds.  Pulmonary:     Effort: Pulmonary effort is normal.     Breath sounds: Normal breath sounds.  Neurological:     Mental Status: She is alert.      Assessment & Plan:   Primary hypertension Assessment & Plan: Uncontrolled Start taking olmesartan-hydrochlorothiazide 40-12.5 mg daily. Discontinue lisinopril-hydrochlorothiazide 20-25 mg. Start taking hydralazine 25 mg twice daily. Continue taking labetalol 400 mg daily by mouth.  Medication Instructions: Take your blood pressure medication at the same time each day. After taking your medication, check your blood pressure at least an hour later. If your first reading is >140/90 mmHg, wait at least 10 minutes and recheck your blood pressure. Side Effects: In the initial days of therapy, you may experience dizziness or lightheadedness as your body adjusts to the lower blood pressure; this is expected. Diet and Lifestyle: Adhere to a low-sodium diet, limiting  intake to less than 1500 mg daily, and increase your physical activity. Avoid over-the-counter NSAIDs such as ibuprofen and naproxen while on this medication. Hydration and Nutrition: Stay well-hydrated by drinking at least 64 ounces of water daily. Increase your servings of fruits and vegetables and avoid excessive sodium in your diet. Long-Term Considerations: Uncontrolled hypertension can increase the risk of cardiovascular diseases, including stroke, coronary artery disease, and heart failure.  Please report to the emergency department if your blood pressure exceeds 180/120 and is accompanied by symptoms such as headaches, chest pain, palpitations, blurred vision, or dizziness.  Orders: -  Olmesartan Medoxomil-HCTZ; Take 1 tablet by mouth daily.  Dispense: 30 tablet; Refill: 0  Type 2 diabetes mellitus with hyperglycemia, with long-term current use of insulin (HCC) Assessment & Plan: Referral placed to endocrinology for collaborative care Encouraged to continue her current treatment regiment Encourage decreasing her intake of high sugar foods and beverages with increase physical activity Pending hemoglobin A1c Lab Results  Component Value Date   HGBA1C 14.0 (A) 01/26/2020     Orders: -     Hemoglobin A1c -     Microalbumin / creatinine urine ratio -     Ambulatory referral to Endocrinology  Mixed hyperlipidemia Assessment & Plan: Encouraged to continue taking rosuvastatin 5 mg daily Recommended decreasing her intake of greasy, fatty, starchy foods with increased physical activity Pending lipid panel   Vitamin D deficiency -     VITAMIN D 25 Hydroxy (Vit-D Deficiency, Fractures)  Need for hepatitis C screening test -     Hepatitis C antibody  Encounter for screening for HIV -     HIV Antibody (routine testing w rflx)  TSH (thyroid-stimulating hormone deficiency) -     TSH + free T4  Other hyperlipidemia -     Lipid panel -     CMP14+EGFR -     CBC with  Differential/Platelet    Note: This chart has been completed using Engineer, civil (consulting) software, and while attempts have been made to ensure accuracy, certain words and phrases may not be transcribed as intended.   Follow-up: Return in about 1 month (around 03/05/2023) for BP.   Gilmore Laroche, FNP

## 2023-02-03 NOTE — Patient Instructions (Addendum)
I appreciate the opportunity to provide care to you today!    Follow up:  1 month for BP  Labs: please stop by the lab during the week  to get your blood drawn (CBC, CMP, TSH, Lipid profile, HgA1c, Vit D)  Screening: HIV and Hep C   Please schedule medicare annual wellness visit  Hypertension: Start taking olmesartan-hydrochlorothiazide 40-12.5 mg daily. Discontinue lisinopril-hydrochlorothiazide 20-25 mg. Start taking hydralazine 25 mg twice daily. Continue taking labetalol 400 mg daily by mouth.  Medication Instructions: Take your blood pressure medication at the same time each day. After taking your medication, check your blood pressure at least an hour later. If your first reading is >140/90 mmHg, wait at least 10 minutes and recheck your blood pressure. Side Effects: In the initial days of therapy, you may experience dizziness or lightheadedness as your body adjusts to the lower blood pressure; this is expected. Diet and Lifestyle: Adhere to a low-sodium diet, limiting intake to less than 1500 mg daily, and increase your physical activity. Avoid over-the-counter NSAIDs such as ibuprofen and naproxen while on this medication. Hydration and Nutrition: Stay well-hydrated by drinking at least 64 ounces of water daily. Increase your servings of fruits and vegetables and avoid excessive sodium in your diet. Long-Term Considerations: Uncontrolled hypertension can increase the risk of cardiovascular diseases, including stroke, coronary artery disease, and heart failure.  Please report to the emergency department if your blood pressure exceeds 180/120 and is accompanied by symptoms such as headaches, chest pain, palpitations, blurred vision, or dizziness.   Referrals today- Endocrinology  Attached with your AVS, you will find valuable resources for self-education. I highly recommend dedicating some time to thoroughly examine them.   Please continue to a heart-healthy diet and increase your  physical activities. Try to exercise for at least five days a week.    It was a pleasure to see you and I look forward to continuing to work together on your health and well-being. Please do not hesitate to call the office if you need care or have questions about your care.  In case of emergency, please visit the Emergency Department for urgent care, or contact our clinic at [phone] to schedule an appointment. We're here to help you!   Have a wonderful day and week. With Gratitude, Gilmore Laroche MSN, FNP-BC

## 2023-02-03 NOTE — Assessment & Plan Note (Signed)
Encouraged to continue taking rosuvastatin 5 mg daily Recommended decreasing her intake of greasy, fatty, starchy foods with increased physical activity Pending lipid panel

## 2023-02-03 NOTE — Assessment & Plan Note (Signed)
Uncontrolled Start taking olmesartan-hydrochlorothiazide 40-12.5 mg daily. Discontinue lisinopril-hydrochlorothiazide 20-25 mg. Start taking hydralazine 25 mg twice daily. Continue taking labetalol 400 mg daily by mouth.  Medication Instructions: Take your blood pressure medication at the same time each day. After taking your medication, check your blood pressure at least an hour later. If your first reading is >140/90 mmHg, wait at least 10 minutes and recheck your blood pressure. Side Effects: In the initial days of therapy, you may experience dizziness or lightheadedness as your body adjusts to the lower blood pressure; this is expected. Diet and Lifestyle: Adhere to a low-sodium diet, limiting intake to less than 1500 mg daily, and increase your physical activity. Avoid over-the-counter NSAIDs such as ibuprofen and naproxen while on this medication. Hydration and Nutrition: Stay well-hydrated by drinking at least 64 ounces of water daily. Increase your servings of fruits and vegetables and avoid excessive sodium in your diet. Long-Term Considerations: Uncontrolled hypertension can increase the risk of cardiovascular diseases, including stroke, coronary artery disease, and heart failure.  Please report to the emergency department if your blood pressure exceeds 180/120 and is accompanied by symptoms such as headaches, chest pain, palpitations, blurred vision, or dizziness.

## 2023-02-03 NOTE — Assessment & Plan Note (Signed)
Referral placed to endocrinology for collaborative care Encouraged to continue her current treatment regiment Encourage decreasing her intake of high sugar foods and beverages with increase physical activity Pending hemoglobin A1c Lab Results  Component Value Date   HGBA1C 14.0 (A) 01/26/2020

## 2023-02-04 ENCOUNTER — Telehealth: Payer: Self-pay | Admitting: Family Medicine

## 2023-02-04 NOTE — Telephone Encounter (Signed)
Copied from CRM [id number]. Topic: Clinical - Medication Refill >> Feb 04, 2023 12:09 PM Larwance Sachs wrote: Most Recent Primary Care Visit:  Provider: Gilmore Laroche  Department: [facility]  Visit Type: NEW PATIENT  Date: 02/03/2023  Medication: ***  Has the patient contacted their pharmacy?  (Agent: If no, request that the patient contact the pharmacy for the refill. If patient does not wish to contact the pharmacy document the reason why and proceed with request.) (Agent: If yes, when and what did the pharmacy advise?)  Is this the correct pharmacy for this prescription?  If no, delete pharmacy and type the correct one.  This is the patient's preferred pharmacy: @PREFPHARMACY @  Has the prescription been filled recently?   Is the patient out of the medication?   Has the patient been seen for an appointment in the last year OR does the patient have an upcoming appointment?   Can we respond through MyChart?   Agent: Please be advised that Rx refills may take up to 3 business days. We ask that you follow-up with your pharmacy.

## 2023-02-05 ENCOUNTER — Other Ambulatory Visit: Payer: Self-pay

## 2023-02-05 ENCOUNTER — Telehealth: Payer: Self-pay | Admitting: Family Medicine

## 2023-02-05 DIAGNOSIS — E1165 Type 2 diabetes mellitus with hyperglycemia: Secondary | ICD-10-CM

## 2023-02-05 MED ORDER — GLIPIZIDE ER 10 MG PO TB24: 10.0000 mg | ORAL_TABLET | Freq: Every day | ORAL | 0 refills | Status: DC

## 2023-02-05 NOTE — Telephone Encounter (Signed)
Prescription Request  02/05/2023  LOV: 02/03/2023  What is the name of the medication or equipment? glipiZIDE (GLUCOTROL XL) 10 MG 24 hr tablet [[id number]]  DISCONTINUED    Have you contacted your pharmacy to request a refill? No   Which pharmacy would you like this sent to?  [facility] [address] [place] - [address] [place] [address] Phone: [phone] Fax: [phone]    Patient notified that their request is being sent to the clinical staff for review and that they should receive a response within 2 business days.   Please advise at [facility] [phone]

## 2023-02-05 NOTE — Telephone Encounter (Signed)
Pt was seen on 11/05 in office notes pcp states pt takes glipizide, medication sent to pharmacy.

## 2023-02-28 ENCOUNTER — Other Ambulatory Visit: Payer: Self-pay | Admitting: Family Medicine

## 2023-02-28 DIAGNOSIS — I10 Essential (primary) hypertension: Secondary | ICD-10-CM

## 2023-03-03 ENCOUNTER — Other Ambulatory Visit: Payer: Self-pay | Admitting: Family Medicine

## 2023-03-03 DIAGNOSIS — Z794 Long term (current) use of insulin: Secondary | ICD-10-CM

## 2023-03-06 ENCOUNTER — Ambulatory Visit: Payer: 59 | Admitting: Family Medicine

## 2023-03-29 ENCOUNTER — Other Ambulatory Visit: Payer: Self-pay | Admitting: Family Medicine

## 2023-03-29 DIAGNOSIS — I10 Essential (primary) hypertension: Secondary | ICD-10-CM

## 2023-04-04 ENCOUNTER — Other Ambulatory Visit: Payer: Self-pay | Admitting: Family Medicine

## 2023-04-04 DIAGNOSIS — E1165 Type 2 diabetes mellitus with hyperglycemia: Secondary | ICD-10-CM

## 2023-04-06 NOTE — Telephone Encounter (Signed)
 Copied from CRM [id number]. Topic: Clinical - Medication Question >> Apr 06, 2023  9:26 AM Monisha R wrote: Reason for CRM: Patient said medications needs to approved by PCP glipiZIDE (GLUCOTROL XL) 10 MG 24 hr tablet rosuvastatin (CRESTOR) 5 MG tablet

## 2023-04-22 DIAGNOSIS — E1165 Type 2 diabetes mellitus with hyperglycemia: Secondary | ICD-10-CM | POA: Diagnosis not present

## 2023-04-22 DIAGNOSIS — Z1159 Encounter for screening for other viral diseases: Secondary | ICD-10-CM | POA: Diagnosis not present

## 2023-04-22 DIAGNOSIS — Z794 Long term (current) use of insulin: Secondary | ICD-10-CM | POA: Diagnosis not present

## 2023-04-22 DIAGNOSIS — E559 Vitamin D deficiency, unspecified: Secondary | ICD-10-CM | POA: Diagnosis not present

## 2023-04-23 LAB — CMP14+EGFR
ALT: 16 [IU]/L (ref 0–32)
AST: 13 [IU]/L (ref 0–40)
Albumin: 3.9 g/dL (ref 3.9–4.9)
Alkaline Phosphatase: 147 [IU]/L — ABNORMAL HIGH (ref 44–121)
BUN/Creatinine Ratio: 20 (ref 9–23)
BUN: 28 mg/dL — ABNORMAL HIGH (ref 6–24)
Bilirubin Total: <0.2 mg/dL (ref 0.0–1.2)
CO2: 18 mmol/L — ABNORMAL LOW (ref 20–29)
Calcium: 9.4 mg/dL (ref 8.7–10.2)
Chloride: 104 mmol/L (ref 96–106)
Creatinine, Ser: 1.38 mg/dL — ABNORMAL HIGH (ref 0.57–1.00)
Globulin, Total: 2.9 g/dL (ref 1.5–4.5)
Glucose: 178 mg/dL — ABNORMAL HIGH (ref 70–99)
Potassium: 4.5 mmol/L (ref 3.5–5.2)
Sodium: 139 mmol/L (ref 134–144)
Total Protein: 6.8 g/dL (ref 6.0–8.5)
eGFR: 49 mL/min/{1.73_m2} — ABNORMAL LOW (ref >59)

## 2023-04-23 LAB — CBC WITH DIFFERENTIAL/PLATELET
Basophils Absolute: 0.1 10*3/uL (ref 0.0–0.2)
Basos: 1 %
EOS (ABSOLUTE): 0.2 10*3/uL (ref 0.0–0.4)
Eos: 3 %
Hematocrit: 32.8 % — ABNORMAL LOW (ref 34.0–46.6)
Hemoglobin: 11.1 g/dL (ref 11.1–15.9)
Immature Grans (Abs): 0 10*3/uL (ref 0.0–0.1)
Immature Granulocytes: 0 %
Lymphocytes Absolute: 1.9 10*3/uL (ref 0.7–3.1)
Lymphs: 32 %
MCH: 30.1 pg (ref 26.6–33.0)
MCHC: 33.8 g/dL (ref 31.5–35.7)
MCV: 89 fL (ref 79–97)
Monocytes Absolute: 0.3 10*3/uL (ref 0.1–0.9)
Monocytes: 6 %
Neutrophils Absolute: 3.5 10*3/uL (ref 1.4–7.0)
Neutrophils: 58 %
Platelets: 293 10*3/uL (ref 150–450)
RBC: 3.69 x10E6/uL — ABNORMAL LOW (ref 3.77–5.28)
RDW: 12 % (ref 11.7–15.4)
WBC: 6 10*3/uL (ref 3.4–10.8)

## 2023-04-23 LAB — LIPID PANEL
Chol/HDL Ratio: 5.5 {ratio} — ABNORMAL HIGH (ref 0.0–4.4)
Cholesterol, Total: 181 mg/dL (ref 100–199)
HDL: 33 mg/dL — ABNORMAL LOW (ref >39)
LDL Chol Calc (NIH): 121 mg/dL — ABNORMAL HIGH (ref 0–99)
Triglycerides: 152 mg/dL — ABNORMAL HIGH (ref 0–149)
VLDL Cholesterol Cal: 27 mg/dL (ref 5–40)

## 2023-04-23 LAB — HIV ANTIBODY (ROUTINE TESTING W REFLEX): HIV Screen 4th Generation wRfx: NONREACTIVE

## 2023-04-23 LAB — HEMOGLOBIN A1C
Est. average glucose Bld gHb Est-mCnc: 174 mg/dL
Hgb A1c MFr Bld: 7.7 % — ABNORMAL HIGH (ref 4.8–5.6)

## 2023-04-23 LAB — VITAMIN D 25 HYDROXY (VIT D DEFICIENCY, FRACTURES): Vit D, 25-Hydroxy: 7.9 ng/mL — ABNORMAL LOW (ref 30.0–100.0)

## 2023-04-23 LAB — TSH+FREE T4
Free T4: 1.39 ng/dL (ref 0.82–1.77)
TSH: 1.73 u[IU]/mL (ref 0.450–4.500)

## 2023-04-23 LAB — HEPATITIS C ANTIBODY: Hep C Virus Ab: NONREACTIVE

## 2023-04-26 ENCOUNTER — Other Ambulatory Visit: Payer: Self-pay | Admitting: Family Medicine

## 2023-04-26 DIAGNOSIS — E559 Vitamin D deficiency, unspecified: Secondary | ICD-10-CM

## 2023-04-26 MED ORDER — VITAMIN D (ERGOCALCIFEROL) 1.25 MG (50000 UNIT) PO CAPS: 50000.0000 [IU] | ORAL_CAPSULE | ORAL | 1 refills | Status: DC

## 2023-04-26 NOTE — Progress Notes (Signed)
Please inform the patient that her hemoglobin A1c is currently 7.7. I recommend that she continue her current treatment regimen, decrease her intake of high-sugar foods and beverages, and increase physical activity. She is encouraged to continue taking rosuvastatin 5 mg daily as prescribed and decrease her intake of greasy, fatty, and starchy foods while increasing physical activity. A prescription for a weekly vitamin D supplement has been sent to the pharmacy because her vitamin D levels are slightly low. I also recommend increasing her fluid intake to 64 ounces daily to stay well hydrated. All other labs are stable.

## 2023-04-28 ENCOUNTER — Ambulatory Visit: Payer: 59 | Admitting: Family Medicine

## 2023-04-28 ENCOUNTER — Other Ambulatory Visit: Payer: Self-pay | Admitting: Family Medicine

## 2023-04-28 DIAGNOSIS — I10 Essential (primary) hypertension: Secondary | ICD-10-CM

## 2023-04-29 ENCOUNTER — Telehealth: Payer: Self-pay | Admitting: Family Medicine

## 2023-04-29 NOTE — Telephone Encounter (Signed)
Copied from CRM [id number]. Topic: Clinical - Medication Question >> Apr 29, 2023 10:16 AM [name] wrote: Reason for CRM: Patient is wanting additional clarification on why the provider is requesting she take Vitamin D? Please give her a call back at [phone].

## 2023-04-30 ENCOUNTER — Encounter: Payer: Self-pay | Admitting: Family Medicine

## 2023-04-30 ENCOUNTER — Ambulatory Visit (INDEPENDENT_AMBULATORY_CARE_PROVIDER_SITE_OTHER): Payer: 59 | Admitting: Family Medicine

## 2023-04-30 VITALS — BP 148/80 | HR 102 | Resp 16 | Ht 63.0 in | Wt 174.1 lb

## 2023-04-30 DIAGNOSIS — E782 Mixed hyperlipidemia: Secondary | ICD-10-CM | POA: Diagnosis not present

## 2023-04-30 DIAGNOSIS — I10 Essential (primary) hypertension: Secondary | ICD-10-CM

## 2023-04-30 DIAGNOSIS — E1165 Type 2 diabetes mellitus with hyperglycemia: Secondary | ICD-10-CM

## 2023-04-30 DIAGNOSIS — Z794 Long term (current) use of insulin: Secondary | ICD-10-CM

## 2023-04-30 MED ORDER — ROSUVASTATIN CALCIUM 5 MG PO TABS: 5.0000 mg | ORAL_TABLET | Freq: Every day | ORAL | 3 refills | Status: DC

## 2023-04-30 MED ORDER — OLMESARTAN MEDOXOMIL-HCTZ 40-25 MG PO TABS
1.0000 | ORAL_TABLET | Freq: Every day | ORAL | 1 refills | Status: DC
Start: 2023-04-30 — End: 2023-06-24

## 2023-04-30 MED ORDER — HUMALOG MIX 75/25 KWIKPEN (75-25) 100 UNIT/ML ~~LOC~~ SUPN: 15.0000 [IU] | PEN_INJECTOR | Freq: Two times a day (BID) | SUBCUTANEOUS | 0 refills | Status: DC

## 2023-04-30 MED ORDER — HYDRALAZINE HCL 25 MG PO TABS: 25.0000 mg | ORAL_TABLET | Freq: Two times a day (BID) | ORAL | 1 refills | Status: DC

## 2023-04-30 NOTE — Assessment & Plan Note (Signed)
Rx refilled.

## 2023-04-30 NOTE — Assessment & Plan Note (Signed)
The patient reports compliance with her current treatment regimen. However, her blood pressure remains uncontrolled. As a result, olmesartan-hydrochlorothiazide 40-12.5 mg will be discontinued, and therapy will be initiated with olmesartan 40-25 mg daily. She is encouraged to continue taking hydralazine 25 mg BID and labetalol 400 mg BID. A low-sodium diet of less than 2,300 mg daily is recommended, along with increased physical activity, aiming for 150 minutes of moderate-intensity exercise per week to help manage blood pressure effectively. The patient is legally blind and reports that her family members administer her medications, raising uncertainty about her treatment compliance. She is encouraged to bring ambulatory blood pressure readings to her next visit for further assessment. Uncontrolled hypertension significantly increases the risk of cardiovascular diseases, including stroke, coronary artery disease, and heart failure. The patient is advised to seek emergency care if her blood pressure exceeds 180/120 and is accompanied by symptoms such as headaches, chest pain, palpitations, blurred vision, or dizziness.  BP Readings from Last 3 Encounters:  04/30/23 (!) 148/80  02/03/23 (!) 140/87  01/17/21 (!) 134/91

## 2023-04-30 NOTE — Progress Notes (Signed)
Established Patient Office Visit  Subjective:  Patient ID: Dawn Benjamin, female    DOB: 1980-11-06  Age: 43 y.o. MRN: [id number]  CC:  Chief Complaint  Patient presents with   Hypertension    1 month follow up    Diabetes    Ran out of the health dept humalog 75/25 insulin she had been taking and needs more.     HPI Dawn Benjamin is a 43 y.o. female with past medical history of hypertension, type II diabetes and hyperlipidemia presents for blood pressure  f/u. For the details of today's visit, please refer to the assessment and plan.     Past Medical History:  Diagnosis Date   Allergy    Anxiety    Diabetes mellitus without complication (HCC)    Hypertension    Menorrhagia 05/09/2019   Regular cycles    Past Surgical History:  Procedure Laterality Date   CHOLECYSTECTOMY     DENTAL SURGERY      Family History  Problem Relation Age of Onset   Hypertension Mother    Diabetes Mother    Cancer Mother        colon   Diabetes Paternal Aunt    Hypertension Paternal Aunt     Social History   Socioeconomic History   Marital status: Single    Spouse name: Not on file   Number of children: 1   Years of education: Not on file   Highest education level: 12th grade  Occupational History   Occupation: CNA     Comment: [employer]  Tobacco Use   Smoking status: Every Day    Current packs/day: 0.10    Types: Cigarettes   Smokeless tobacco: Never  Vaping Use   Vaping status: Never Used  Substance and Sexual Activity   Alcohol use: Yes   Drug use: No   Sexual activity: Yes    Birth control/protection: Condom  Other Topics Concern   Not on file  Social History Narrative   Lives with son and boyfriend   Son-Dawn Benjamin 22 this month 03/2019      Enjoys: shopping and spending time with girlfriends      Diet: was eating fried foods, but is working to [employer]- does like veggies and fruits   Caffeine: pepsi-20 oz; some sweet tea-weekly    Water: 3-4  bottles daily of 8 oz      Wears seat belt   Smoke detectors at home   Does not use phone while driving      Social Drivers of Health   Financial Resource Strain: Low Risk  (01/26/2020)   Overall Financial Resource Strain (CARDIA)    Difficulty of Paying Living Expenses: Not hard at all  Food Insecurity: No Food Insecurity (01/26/2020)   Hunger Vital Sign    Worried About Running Out of Food in the Last Year: Never true    Ran Out of Food in the Last Year: Never true  Transportation Needs: No Transportation Needs (01/26/2020)   PRAPARE - Administrator, Civil Service (Medical): No    Lack of Transportation (Non-Medical): No  Physical Activity: Insufficiently Active (01/26/2020)   Exercise Vital Sign    Days of Exercise per Week: 3 days    Minutes of Exercise per Session: 20 min  Stress: No Stress Concern Present (01/26/2020)   Dawn Benjamin of Occupational Health - Occupational Stress Questionnaire    Feeling of Stress : Not at all  Social Connections: Socially  Isolated (01/26/2020)   Social Connection and Isolation Panel [NHANES]    Frequency of Communication with Friends and Family: More than three times a week    Frequency of Social Gatherings with Friends and Family: Three times a week    Attends Religious Services: Never    Active Member of Clubs or Organizations: No    Attends Banker Meetings: Never    Marital Status: Never married  Intimate Partner Violence: Not At Risk (01/26/2020)   Humiliation, Afraid, Rape, and Kick questionnaire    Fear of Current or Ex-Partner: No    Emotionally Abused: No    Physically Abused: No    Sexually Abused: No    Outpatient Medications Prior to Visit  Medication Sig Dispense Refill   Ferrous Sulfate (IRON) 325 (65 Fe) MG TABS Take 1 tablet (325 mg total) by mouth 2 (two) times daily with a meal. 60 tablet 3   glipiZIDE (GLUCOTROL XL) 10 MG 24 hr tablet Take 1 tablet by mouth once daily with breakfast 30  tablet 0   labetalol (NORMODYNE) 200 MG tablet Take 400 mg by mouth 2 (two) times daily.     metFORMIN (GLUCOPHAGE) 500 MG tablet Take 500 mg by mouth 2 (two) times daily.     Vitamin D, Ergocalciferol, (DRISDOL) 1.25 MG (50000 UNIT) CAPS capsule Take 1 capsule (50,000 Units total) by mouth every 7 (seven) days. 20 capsule 1   HUMALOG MIX 75/25 KWIKPEN (75-25) 100 UNIT/ML KwikPen Inject 75 Units into the skin 2 (two) times daily.     hydrALAZINE (APRESOLINE) 25 MG tablet Take 25 mg by mouth 3 (three) times daily.     insulin isophane & regular human (NOVOLIN 70/30 FLEXPEN RELION) (70-30) 100 UNIT/ML KwikPen Inject 15 Units into the skin 2 (two) times daily. 15 mL 11   olmesartan-hydrochlorothiazide (BENICAR HCT) 40-12.5 MG tablet Take 1 tablet by mouth once daily 30 tablet 0   rosuvastatin (CRESTOR) 5 MG tablet Take 1 tablet (5 mg total) by mouth daily. 30 tablet 3   insulin detemir (LEVEMIR FLEXTOUCH) 100 UNIT/ML FlexPen Inject 20 Units into the skin at bedtime. (Patient not taking: Reported on 04/30/2023) 15 mL 3   No facility-administered medications prior to visit.    No Known Allergies  ROS Review of Systems  Constitutional:  Negative for chills and fever.  Eyes:  Negative for visual disturbance.  Respiratory:  Negative for chest tightness and shortness of breath.   Neurological:  Negative for dizziness and headaches.      Objective:    Physical Exam HENT:     Head: Normocephalic.     Mouth/Throat:     Mouth: Mucous membranes are moist.  Cardiovascular:     Rate and Rhythm: Normal rate.     Heart sounds: Normal heart sounds.  Pulmonary:     Effort: Pulmonary effort is normal.     Breath sounds: Normal breath sounds.  Neurological:     Mental Status: She is alert.     BP (!) 148/80 (BP Location: Left Arm)   Pulse (!) 102   Resp 16   Ht 5\' 3"  (1.6 m)   Wt 174 lb 1.9 oz (79 kg)   SpO2 93%   BMI 30.84 kg/m  Wt Readings from Last 3 Encounters:  04/30/23 174 lb 1.9  oz (79 kg)  02/03/23 171 lb (77.6 kg)  01/17/21 135 lb (61.2 kg)    Lab Results  Component Value Date   TSH 1.730 04/22/2023  Lab Results  Component Value Date   WBC 6.0 04/22/2023   HGB 11.1 04/22/2023   HCT 32.8 (L) 04/22/2023   MCV 89 04/22/2023   PLT 293 04/22/2023   Lab Results  Component Value Date   NA 139 04/22/2023   K 4.5 04/22/2023   CO2 18 (L) 04/22/2023   GLUCOSE 178 (H) 04/22/2023   BUN 28 (H) 04/22/2023   CREATININE 1.38 (H) 04/22/2023   BILITOT <0.2 04/22/2023   ALKPHOS 147 (H) 04/22/2023   AST 13 04/22/2023   ALT 16 04/22/2023   PROT 6.8 04/22/2023   ALBUMIN 3.9 04/22/2023   CALCIUM 9.4 04/22/2023   ANIONGAP 10 07/25/2020   EGFR 49 (L) 04/22/2023   Lab Results  Component Value Date   CHOL 181 04/22/2023   Lab Results  Component Value Date   HDL 33 (L) 04/22/2023   Lab Results  Component Value Date   LDLCALC 121 (H) 04/22/2023   Lab Results  Component Value Date   TRIG 152 (H) 04/22/2023   Lab Results  Component Value Date   CHOLHDL 5.5 (H) 04/22/2023   Lab Results  Component Value Date   HGBA1C 7.7 (H) 04/22/2023      Assessment & Plan:  Primary hypertension Assessment & Plan: The patient reports compliance with her current treatment regimen. However, her blood pressure remains uncontrolled. As a result, olmesartan-hydrochlorothiazide 40-12.5 mg will be discontinued, and therapy will be initiated with olmesartan 40-25 mg daily. She is encouraged to continue taking hydralazine 25 mg BID and labetalol 400 mg BID. A low-sodium diet of less than 2,300 mg daily is recommended, along with increased physical activity, aiming for 150 minutes of moderate-intensity exercise per week to help manage blood pressure effectively. The patient is legally blind and reports that her family members administer her medications, raising uncertainty about her treatment compliance. She is encouraged to bring ambulatory blood pressure readings to her next visit  for further assessment. Uncontrolled hypertension significantly increases the risk of cardiovascular diseases, including stroke, coronary artery disease, and heart failure. The patient is advised to seek emergency care if her blood pressure exceeds 180/120 and is accompanied by symptoms such as headaches, chest pain, palpitations, blurred vision, or dizziness.  BP Readings from Last 3 Encounters:  04/30/23 (!) 148/80  02/03/23 (!) 140/87  01/17/21 (!) 134/91     Orders: -     Olmesartan Medoxomil-HCTZ; Take 1 tablet by mouth daily.  Dispense: 30 tablet; Refill: 1 -     hydrALAZINE HCl; Take 1 tablet (25 mg total) by mouth in the morning and at bedtime.  Dispense: 90 tablet; Refill: 1  Type 2 diabetes mellitus with hyperglycemia, unspecified whether long term insulin use (HCC) Assessment & Plan: Rx refilled  Orders: -     Rosuvastatin Calcium; Take 1 tablet (5 mg total) by mouth daily.  Dispense: 90 tablet; Refill: 3 -     HumaLOG Mix 75/25 KwikPen; Inject 15 Units into the skin 2 (two) times daily.  Dispense: 15 mL; Refill: 0  Mixed hyperlipidemia Assessment & Plan: Rx refilled  Orders: -     Rosuvastatin Calcium; Take 1 tablet (5 mg total) by mouth daily.  Dispense: 90 tablet; Refill: 3  Note: This chart has been completed using Engineer, civil (consulting) software, and while attempts have been made to ensure accuracy, certain words and phrases may not be transcribed as intended.    Follow-up: Return in about 1 month (around 05/29/2023) for BP.   Gilmore Laroche,  FNP

## 2023-04-30 NOTE — Patient Instructions (Addendum)
I appreciate the opportunity to provide care to you today!    Follow up:  1 months   Hypertension Management  Your current blood pressure is above the target goal of <140/90 mmHg. To address this, please start taking olmesartan-hydrochlorothiazide 40-25. Hydralazine 25 mg BID, and labetalol 400 mg BID daily  medication Instructions: Take your blood pressure medication at the same time each day. After taking your medication, check your blood pressure at least an hour later. If your first reading is >140/90 mmHg, wait at least 10 minutes and recheck your blood pressure. Side Effects: In the initial days of therapy, you may experience dizziness or lightheadedness as your body adjusts to the lower blood pressure; this is expected. Diet and Lifestyle: Adhere to a low-sodium diet, limiting intake to less than 1500 mg daily, and increase your physical activity. Avoid over-the-counter NSAIDs such as ibuprofen and naproxen while on this medication. Hydration and Nutrition: Stay well-hydrated by drinking at least 64 ounces of water daily. Increase your servings of fruits and vegetables and avoid excessive sodium in your diet. Long-Term Considerations: Uncontrolled hypertension can increase the risk of cardiovascular diseases, including stroke, coronary artery disease, and heart failure.  Please report to the emergency department if your blood pressure exceeds 180/120 and is accompanied by symptoms such as headaches, chest pain, palpitations, blurred vision, or dizziness.   Attached with your AVS, you will find valuable resources for self-education. I highly recommend dedicating some time to thoroughly examine them.   Please continue to a heart-healthy diet and increase your physical activities. Try to exercise for at least five days a week.    It was a pleasure to see you and I look forward to continuing to work together on your health and well-being. Please do not hesitate to call the office  if you need care or have questions about your care.  In case of emergency, please visit the Emergency Department for urgent care, or contact our clinic at [phone] to schedule an appointment. We're here to help you!   Have a wonderful day and week. With Gratitude, Gilmore Laroche MSN, FNP-BC

## 2023-05-01 NOTE — Telephone Encounter (Signed)
Spoke with pt at her appt and she verbalized understanding

## 2023-05-03 ENCOUNTER — Other Ambulatory Visit: Payer: Self-pay | Admitting: Family Medicine

## 2023-05-03 DIAGNOSIS — E1165 Type 2 diabetes mellitus with hyperglycemia: Secondary | ICD-10-CM

## 2023-05-13 ENCOUNTER — Ambulatory Visit: Payer: 59 | Admitting: Nurse Practitioner

## 2023-05-28 ENCOUNTER — Ambulatory Visit: Payer: 59 | Admitting: Family Medicine

## 2023-06-01 NOTE — Patient Instructions (Signed)

## 2023-06-02 ENCOUNTER — Encounter: Payer: Self-pay | Admitting: Nurse Practitioner

## 2023-06-02 ENCOUNTER — Ambulatory Visit (INDEPENDENT_AMBULATORY_CARE_PROVIDER_SITE_OTHER): Payer: 59 | Admitting: Nurse Practitioner

## 2023-06-02 VITALS — BP 140/90 | HR 90 | Ht 63.0 in | Wt 173.8 lb

## 2023-06-02 DIAGNOSIS — Z794 Long term (current) use of insulin: Secondary | ICD-10-CM

## 2023-06-02 DIAGNOSIS — E782 Mixed hyperlipidemia: Secondary | ICD-10-CM

## 2023-06-02 DIAGNOSIS — I10 Essential (primary) hypertension: Secondary | ICD-10-CM

## 2023-06-02 DIAGNOSIS — E1122 Type 2 diabetes mellitus with diabetic chronic kidney disease: Secondary | ICD-10-CM | POA: Diagnosis not present

## 2023-06-02 DIAGNOSIS — Z7984 Long term (current) use of oral hypoglycemic drugs: Secondary | ICD-10-CM

## 2023-06-02 DIAGNOSIS — E1165 Type 2 diabetes mellitus with hyperglycemia: Secondary | ICD-10-CM | POA: Diagnosis not present

## 2023-06-02 DIAGNOSIS — N1831 Chronic kidney disease, stage 3a: Secondary | ICD-10-CM | POA: Diagnosis not present

## 2023-06-02 MED ORDER — DEXCOM G7 SENSOR MISC: 1.0000 | 3 refills | Status: DC

## 2023-06-02 MED ORDER — DEXCOM G7 RECEIVER DEVI: 1.0000 | Freq: Once | 0 refills | Status: DC

## 2023-06-02 MED ORDER — METFORMIN HCL 500 MG PO TABS: 500.0000 mg | ORAL_TABLET | Freq: Two times a day (BID) | ORAL | 1 refills | Status: DC

## 2023-06-02 MED ORDER — HUMALOG MIX 75/25 KWIKPEN (75-25) 100 UNIT/ML ~~LOC~~ SUPN: 15.0000 [IU] | PEN_INJECTOR | Freq: Two times a day (BID) | SUBCUTANEOUS | 3 refills | Status: DC

## 2023-06-02 MED ORDER — GLIPIZIDE ER 5 MG PO TB24
5.0000 mg | ORAL_TABLET | Freq: Every day | ORAL | 1 refills | Status: DC
Start: 2023-06-02 — End: 2023-11-24

## 2023-06-02 NOTE — Progress Notes (Signed)
 Endocrinology Consult Note       06/02/2023, 10:21 AM   Subjective:    Patient ID: Dawn Benjamin, female    DOB: 09-Dec-1980.  Dawn Benjamin is being seen in consultation for management of currently uncontrolled symptomatic diabetes requested by  Gilmore Laroche, FNP.   Past Medical History:  Diagnosis Date   Allergy    Anxiety    Diabetes mellitus without complication (HCC)    Hypertension    Menorrhagia 05/09/2019   Regular cycles    Past Surgical History:  Procedure Laterality Date   CHOLECYSTECTOMY     DENTAL SURGERY      Social History   Socioeconomic History   Marital status: Single    Spouse name: Not on file   Number of children: 1   Years of education: Not on file   Highest education level: 12th grade  Occupational History   Occupation: CNA     Comment: [employer]  Tobacco Use   Smoking status: Every Day    Current packs/day: 0.10    Types: Cigarettes   Smokeless tobacco: Never  Vaping Use   Vaping status: Never Used  Substance and Sexual Activity   Alcohol use: Yes   Drug use: No   Sexual activity: Yes    Birth control/protection: Condom  Other Topics Concern   Not on file  Social History Narrative   Lives with son and boyfriend   Son-[name] 22 this month 03/2019      Enjoys: shopping and spending time with girlfriends      Diet: was eating fried foods, but is working to [employer]- does like veggies and fruits   Caffeine: pepsi-20 oz; some sweet tea-weekly    Water: 3-4 bottles daily of 8 oz      Wears seat belt   Smoke detectors at home   Does not use phone while driving      Social Drivers of Health   Financial Resource Strain: Low Risk  (01/26/2020)   Overall Financial Resource Strain (CARDIA)    Difficulty of Paying Living Expenses: Not hard at all  Food Insecurity: No Food Insecurity (01/26/2020)   Hunger Vital Sign    Worried About Running Out  of Food in the Last Year: Never true    Ran Out of Food in the Last Year: Never true  Transportation Needs: No Transportation Needs (01/26/2020)   PRAPARE - Administrator, Civil Service (Medical): No    Lack of Transportation (Non-Medical): No  Physical Activity: Insufficiently Active (01/26/2020)   Exercise Vital Sign    Days of Exercise per Week: 3 days    Minutes of Exercise per Session: 20 min  Stress: No Stress Concern Present (01/26/2020)   [employer] of Occupational Health - Occupational Stress Questionnaire    Feeling of Stress : Not at all  Social Connections: Socially Isolated (01/26/2020)   Social Connection and Isolation Panel [NHANES]    Frequency of Communication with Friends and Family: More than three times a week    Frequency of Social Gatherings with Friends and Family: Three times a week    Attends Religious Services: Never    Active Member  of Clubs or Organizations: No    Attends Banker Meetings: Never    Marital Status: Never married    Family History  Problem Relation Age of Onset   Hypertension Mother    Diabetes Mother    Cancer Mother        colon   Diabetes Paternal Aunt    Hypertension Paternal Aunt     Outpatient Encounter Medications as of 06/02/2023  Medication Sig   Continuous Glucose Receiver (DEXCOM G7 RECEIVER) DEVI 1 Device by Does not apply route once for 1 dose.   Continuous Glucose Sensor (DEXCOM G7 SENSOR) MISC Inject 1 Application into the skin as directed. Change sensor every 10 days as directed.   Ferrous Sulfate (IRON) 325 (65 Fe) MG TABS Take 1 tablet (325 mg total) by mouth 2 (two) times daily with a meal.   glipiZIDE (GLUCOTROL XL) 5 MG 24 hr tablet Take 1 tablet (5 mg total) by mouth daily with breakfast.   hydrALAZINE (APRESOLINE) 25 MG tablet Take 1 tablet (25 mg total) by mouth in the morning and at bedtime.   labetalol (NORMODYNE) 200 MG tablet Take 400 mg by mouth 2 (two) times daily.    olmesartan-hydrochlorothiazide (BENICAR HCT) 40-25 MG tablet Take 1 tablet by mouth daily.   rosuvastatin (CRESTOR) 5 MG tablet Take 1 tablet (5 mg total) by mouth daily.   Vitamin D, Ergocalciferol, (DRISDOL) 1.25 MG (50000 UNIT) CAPS capsule Take 1 capsule (50,000 Units total) by mouth every 7 (seven) days.   [DISCONTINUED] glipiZIDE (GLUCOTROL XL) 10 MG 24 hr tablet Take 1 tablet by mouth once daily with breakfast   [DISCONTINUED] HUMALOG MIX 75/25 KWIKPEN (75-25) 100 UNIT/ML KwikPen Inject 15 Units into the skin 2 (two) times daily.   [DISCONTINUED] metFORMIN (GLUCOPHAGE) 500 MG tablet Take 500 mg by mouth 2 (two) times daily.   HUMALOG MIX 75/25 KWIKPEN (75-25) 100 UNIT/ML KwikPen Inject 15 Units into the skin 2 (two) times daily.   metFORMIN (GLUCOPHAGE) 500 MG tablet Take 1 tablet (500 mg total) by mouth 2 (two) times daily with a meal.   No facility-administered encounter medications on file as of 06/02/2023.    ALLERGIES: No Known Allergies  VACCINATION STATUS:  There is no immunization history on file for this patient.  Diabetes She presents for her initial diabetic visit. She has type 2 diabetes mellitus. Onset time: diagnosed at approx age of 58. Her disease course has been fluctuating. There are no hypoglycemic associated symptoms. There are no hypoglycemic complications. 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 Risk factors for coronary artery disease include diabetes mellitus, dyslipidemia, family history, hypertension and sedentary lifestyle. Current diabetic treatment includes insulin injections and oral agent (dual therapy). She is compliant with treatment most of the time. Her weight is fluctuating minimally. She is following a generally unhealthy diet. When asked about meal planning, she reported none. She has not had a previous visit with a dietitian. She rarely participates in exercise. (She presents today, accompanied by her aunt, for her  consultation with no meter or logs to review.  Her most recent A1c was 7.7%, on 1/22.  She monitors glucose 3-4 times daily.  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  I also see her mother here in the clinic as well (Dawn Benjamin).  She drinks zero sugar pepsi, water, and some juice.  She eats 3 meals per day and snacks between.  She does engage in routine physical activity by walking and doing sit  ups and squats in the house.  She is UTD on eye exam, has never seen podiatry in the past.) An ACE inhibitor/angiotensin II receptor blocker is being taken. She does not see a podiatrist.Eye exam is current.     Review of systems  Constitutional: + Minimally fluctuating body weight, current Body mass index is 30.79 kg/m., no fatigue, no subjective hyperthermia, no subjective hypothermia Eyes: + legally blind ENT: no sore throat, no nodules palpated in throat, no dysphagia/odynophagia, no hoarseness Cardiovascular: no chest pain, no shortness of breath, no palpitations, no leg swelling Respiratory: no cough, no shortness of breath Gastrointestinal: no nausea/vomiting/diarrhea Musculoskeletal: no muscle/joint aches Skin: no rashes, no hyperemia Neurological: no tremors, no numbness, no tingling, no dizziness Psychiatric: no depression, no anxiety  Objective:     BP (!) 140/90 (BP Location: Left Arm, Patient Position: Sitting) Comment: Patient states that she has not taken her BP medication  Pulse 90   Ht 5\' 3"  (1.6 m)   Wt 173 lb 12.8 oz (78.8 kg)   BMI 30.79 kg/m   Wt Readings from Last 3 Encounters:  06/02/23 173 lb 12.8 oz (78.8 kg)  04/30/23 174 lb 1.9 oz (79 kg)  02/03/23 171 lb (77.6 kg)     BP Readings from Last 3 Encounters:  06/02/23 (!) 140/90  04/30/23 (!) 148/80  02/03/23 (!) 140/87     Physical Exam- Limited  Constitutional:  Body mass index is 30.79 kg/m. , not in acute distress, normal state of mind Eyes:  EOMI, no exophthalmos Neck:  Supple Cardiovascular: RRR, no murmurs, rubs, or gallops, no edema Respiratory: Adequate breathing efforts, no crackles, rales, rhonchi, or wheezing Musculoskeletal: no gross deformities, strength intact in all four extremities, no gross restriction of joint movements Skin:  no rashes, no hyperemia Neurological: no tremor with outstretched hands   Diabetic Foot Exam - Simple   No data filed      CMP ( most recent) CMP     Component Value Date/Time   NA 139 04/22/2023 0818   K 4.5 04/22/2023 0818   CL 104 04/22/2023 0818   CO2 18 (L) 04/22/2023 0818   GLUCOSE 178 (H) 04/22/2023 0818   GLUCOSE 455 (H) 07/25/2020 0849   BUN 28 (H) 04/22/2023 0818   CREATININE 1.38 (H) 04/22/2023 0818   CREATININE 0.83 03/09/2019 1018   CALCIUM 9.4 04/22/2023 0818   PROT 6.8 04/22/2023 0818   ALBUMIN 3.9 04/22/2023 0818   AST 13 04/22/2023 0818   ALT 16 04/22/2023 0818   ALKPHOS 147 (H) 04/22/2023 0818   BILITOT <0.2 04/22/2023 0818   EGFR 49 (L) 04/22/2023 0818   GFRNONAA >60 07/25/2020 0849   GFRNONAA 89 03/09/2019 1018     Diabetic Labs (most recent): Lab Results  Component Value Date   HGBA1C 7.7 (H) 04/22/2023   HGBA1C 14.0 (A) 01/26/2020   HGBA1C 12.9 (H) 03/09/2019     Lipid Panel ( most recent) Lipid Panel     Component Value Date/Time   CHOL 181 04/22/2023 0818   TRIG 152 (H) 04/22/2023 0818   HDL 33 (L) 04/22/2023 0818   CHOLHDL 5.5 (H) 04/22/2023 0818   CHOLHDL 4.5 03/09/2019 1018   LDLCALC 121 (H) 04/22/2023 0818   LDLCALC 131 (H) 03/09/2019 1018   LABVLDL 27 04/22/2023 0818      Lab Results  Component Value Date   TSH 1.730 04/22/2023   TSH 1.430 02/01/2020   TSH 1.65 03/09/2019   FREET4 1.39 04/22/2023  Assessment & Plan:   1) Type 2 diabetes mellitus with stage 3a chronic kidney disease, with long-term current use of insulin (HCC) (Primary)  She presents today, accompanied by her aunt, for her consultation with no meter or logs to  review.  Her most recent A1c was 7.7%, on 1/22.  She monitors glucose 3-4 times daily.  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  I also see her mother here in the clinic as well (Dawn Benjamin).  She drinks zero sugar pepsi, water, and some juice.  She eats 3 meals per day and snacks between.  She does engage in routine physical activity by walking and doing sit ups and squats in the house.  She is UTD on eye exam, has never seen podiatry in the past.  She notes she sometimes takes more insulin than what is prescribed due to high glucose readings.  - Dawn Benjamin has currently uncontrolled symptomatic type 2 DM since 43 years of age, with most recent A1c of 7.7 %.   -Recent labs reviewed.  - I had a long discussion with her about the progressive nature of diabetes and the pathology behind its complications. -her diabetes is complicated by CKD stage 3a , and retinopathy (causing blindness), and she remains at a high risk for more acute and chronic complications which include CAD, CVA, CKD, retinopathy, and neuropathy. These are all discussed in detail with her.  The following Lifestyle Medicine recommendations according to American College of Lifestyle Medicine [facility]) were discussed and offered to patient and she agrees to start the journey:  A. Whole Foods, Plant-based plate comprising of fruits and vegetables, plant-based proteins, whole-grain carbohydrates was discussed in detail with the patient.   A list for source of those nutrients were also provided to the patient.  Patient will use only water or unsweetened tea for hydration. B.  The need to stay away from risky substances including alcohol, smoking; obtaining 7 to 9 hours of restorative sleep, at least 150 minutes of moderate intensity exercise weekly, the importance of healthy social connections,  and stress reduction techniques were discussed. C.  A full color page of Calorie density of various food groups per pound showing  examples of each food groups was provided to the patient.  - I have counseled her on diet and weight management by adopting a carbohydrate restricted/protein rich diet. Patient is encouraged to switch to unprocessed or minimally processed complex starch and increased protein intake (animal or plant source), fruits, and vegetables. -  she is advised to stick to a routine mealtimes to eat 3 meals a day and avoid unnecessary snacks (to snack only to correct hypoglycemia).   - she acknowledges that there is a room for improvement in her food and drink choices. - Suggestion is made for her to avoid simple carbohydrates from her diet including Cakes, Sweet Desserts, Ice Cream, Soda (diet and regular), Sweet Tea, Candies, Chips, Cookies, Store Bought Juices, Alcohol in Excess of 1-2 drinks a day, Artificial Sweeteners, Coffee Creamer, and "Sugar-free" Products. This will help patient to have more stable blood glucose profile and potentially avoid unintended weight gain.  - I have approached her with the following individualized plan to manage her diabetes and patient agrees:   -She is advised to continue Humalog 75/25 15 units SQ BID with breakfast and supper if glucose is above 90 and she is eating.    -she is encouraged to start/continue monitoring glucose 4 times daily, before meals and before bed, to  log their readings on the clinic sheets provided, and bring them to review at follow up appointment in 3 months.  She could certainly benefit from CGM given her insulin use.  Will send prescription for Dexcom G7 for her to [facility]  - she is warned not to take insulin without proper monitoring per orders. - Adjustment parameters are given to her for hypo and hyperglycemia in writing. - she is encouraged to call clinic for blood glucose levels less than 70 or above 300 mg /dl. - she is advised to continue Metformin 500 mg po twice daily, therapeutically suitable for patient.  Will keep an eye on kidney  function and discontinue if worsens.  I did advise her to lower her Glipizide to 5 mg XL daily for safety purposes.   she is not a candidate for full dose Metformin due to concurrent renal insufficiency.  - she will be considered for incretin therapy as appropriate next visit.  - Specific targets for  A1c; LDL, HDL, and Triglycerides were discussed with the patient.  2) Blood Pressure /Hypertension:  her blood pressure is controlled to target.   she is advised to continue her current medications as prescribed by PCP.  3) Lipids/Hyperlipidemia:    Review of her recent lipid panel from 04/22/23 showed uncontrolled LDL at 121 and slightly elevated triglycerides of 152.  she is advised to continue Crestor 5 mg daily at bedtime.  Side effects and precautions discussed with her.  4)  Weight/Diet:  her Body mass index is 30.79 kg/m.  -  clearly complicating her diabetes care.   she is a candidate for weight loss. I discussed with her the fact that loss of 5 - 10% of her  current body weight will have the most impact on her diabetes management.  Exercise, and detailed carbohydrates information provided  -  detailed on discharge instructions.  5) Chronic Care/Health Maintenance: -she is on ACEI/ARB and Statin medications and is encouraged to initiate and continue to follow up with Ophthalmology, Dentist, Podiatrist at least yearly or according to recommendations, and advised to stay away from smoking. I have recommended yearly flu vaccine and pneumonia vaccine at least every 5 years; moderate intensity exercise for up to 150 minutes weekly; and sleep for at least 7 hours a day.  - she is advised to maintain close follow up with Gilmore Laroche, FNP for primary care needs, as well as her other providers for optimal and coordinated care.   - Time spent in this patient care: 60 min, of which > 50% was spent in counseling her about her diabetes and the rest reviewing her blood glucose logs, discussing her  hypoglycemia and hyperglycemia episodes, reviewing her current and previous labs/studies (including abstraction from other facilities) and medications doses and developing a long term treatment plan based on the latest standards of care/guidelines; and documenting her care.    Please refer to Patient Instructions for Blood Glucose Monitoring and Insulin/Medications Dosing Guide" in media tab for additional information. Please also refer to "Patient Self Inventory" in the Media tab for reviewed elements of pertinent patient history.  Delane Ginger participated in the discussions, expressed understanding, and voiced agreement with the above plans.  All questions were answered to her satisfaction. she is encouraged to contact clinic should she have any questions or concerns prior to her return visit.     Follow up plan: - Return in about 3 months (around 09/02/2023) for Diabetes F/U with A1c in office, No previsit labs,  Bring meter and logs.    Ronny Bacon, [facility] [address] [place], [place] [address] Phone: [phone] Fax: [phone]  06/02/2023, 10:21 AM

## 2023-06-05 ENCOUNTER — Telehealth: Payer: Self-pay | Admitting: Family Medicine

## 2023-06-05 NOTE — Telephone Encounter (Signed)
 Disability attending statement   Noted  Copied Sleeved  Original in PCP box Copy front desk folder

## 2023-06-22 ENCOUNTER — Telehealth: Payer: Self-pay | Admitting: Family Medicine

## 2023-06-22 NOTE — Telephone Encounter (Signed)
PCS  Noted  Copied Sleeved  Original in PCP box Copy front desk folder

## 2023-06-24 ENCOUNTER — Other Ambulatory Visit: Payer: Self-pay | Admitting: Family Medicine

## 2023-06-24 DIAGNOSIS — I10 Essential (primary) hypertension: Secondary | ICD-10-CM

## 2023-07-07 ENCOUNTER — Other Ambulatory Visit: Payer: Self-pay | Admitting: Family Medicine

## 2023-07-07 DIAGNOSIS — D509 Iron deficiency anemia, unspecified: Secondary | ICD-10-CM

## 2023-07-07 MED ORDER — IRON 325 (65 FE) MG PO TABS
325.0000 mg | ORAL_TABLET | Freq: Two times a day (BID) | ORAL | 3 refills | Status: AC
Start: 2023-07-07 — End: ?

## 2023-07-07 MED ORDER — LABETALOL HCL 200 MG PO TABS: 400.0000 mg | ORAL_TABLET | Freq: Two times a day (BID) | ORAL | 0 refills | Status: DC

## 2023-07-07 NOTE — Telephone Encounter (Signed)
 Copied from CRM [id number]. Topic: Clinical - Medication Refill >> Jul 07, 2023 12:50 PM Shon Hale wrote: Most Recent Primary Care Visit:  Provider: Gilmore Laroche  Department: [facility]  Visit Type: OFFICE VISIT  Date: 04/30/2023  Medication: Ferrous Sulfate (IRON) 325 (65 Fe) MG TABS  labetalol (NORMODYNE) 200 MG tablet Pt requesting for Gilmore Laroche, FNP to take over above rx. Patient currently out of the iron tablets.  Has the patient contacted their pharmacy? Yes Told to reach out to office.  Is this the correct pharmacy for this prescription? Yes This is the patient's preferred pharmacy:  [facility] [address] [place] - [address] [place] [address] Phone: [phone] Fax: [phone]  Has the prescription been filled recently? No  Is the patient out of the medication? Yes  Has the patient been seen for an appointment in the last year OR does the patient have an upcoming appointment? Yes  Can we respond through MyChart? No  Agent: Please be advised that Rx refills may take up to 3 business days. We ask that you follow-up with your pharmacy.

## 2023-07-07 NOTE — Telephone Encounter (Signed)
 Last Fill: Ferrous Sulfate: 03/10/19     Labetolol: 12/19/22  Last OV: 04/30/23 Next OV: 07/16/23  Routing to provider for review/authorization.

## 2023-07-16 ENCOUNTER — Ambulatory Visit (INDEPENDENT_AMBULATORY_CARE_PROVIDER_SITE_OTHER): Payer: 59 | Admitting: Family Medicine

## 2023-07-16 ENCOUNTER — Encounter: Payer: Self-pay | Admitting: Family Medicine

## 2023-07-16 VITALS — BP 138/84 | HR 91 | Resp 18 | Ht 63.0 in | Wt 173.0 lb

## 2023-07-16 DIAGNOSIS — I10 Essential (primary) hypertension: Secondary | ICD-10-CM | POA: Diagnosis not present

## 2023-07-16 MED ORDER — OLMESARTAN MEDOXOMIL-HCTZ 40-25 MG PO TABS: 1.0000 | ORAL_TABLET | Freq: Every day | ORAL | 2 refills | Status: DC

## 2023-07-16 NOTE — Patient Instructions (Signed)
I appreciate the opportunity to provide care to you today!    Follow up:  4 months  Labs: please stop by the lab today to get your blood drawn (BMP)   Attached with your AVS, you will find valuable resources for self-education. I highly recommend dedicating some time to thoroughly examine them.   Please continue to a heart-healthy diet and increase your physical activities. Try to exercise for at least five days a week.    It was a pleasure to see you and I look forward to continuing to work together on your health and well-being. Please do not hesitate to call the office if you need care or have questions about your care.  In case of emergency, please visit the Emergency Department for urgent care, or contact our clinic at [phone] to schedule an appointment. We're here to help you!   Have a wonderful day and week. With Gratitude, Gilmore Laroche MSN, FNP-BC

## 2023-07-16 NOTE — Progress Notes (Signed)
 Established Patient Office Visit  Subjective:  Patient ID: Dawn Benjamin, female    DOB: 27-Apr-1980  Age: 43 y.o. MRN: [id number]  CC:  Chief Complaint  Patient presents with   Hypertension    1 month follow up on bp     HPI Dawn Benjamin is a 43 y.o. female with past medical history of Hypertension presents for f/u of  chronic medical conditions.  For the details of today's visit, please refer to the assessment and plan.     Past Medical History:  Diagnosis Date   Allergy    Anxiety    Diabetes mellitus without complication (HCC)    Hypertension    Menorrhagia 05/09/2019   Regular cycles    Past Surgical History:  Procedure Laterality Date   CHOLECYSTECTOMY     DENTAL SURGERY      Family History  Problem Relation Age of Onset   Hypertension Mother    Diabetes Mother    Cancer Mother        colon   Diabetes Paternal Aunt    Hypertension Paternal Aunt     Social History   Socioeconomic History   Marital status: Single    Spouse name: Not on file   Number of children: 1   Years of education: Not on file   Highest education level: 12th grade  Occupational History   Occupation: CNA     Comment: [employer]  Tobacco Use   Smoking status: Every Day    Current packs/day: 0.10    Types: Cigarettes   Smokeless tobacco: Never  Vaping Use   Vaping status: Never Used  Substance and Sexual Activity   Alcohol use: Yes   Drug use: No   Sexual activity: Yes    Birth control/protection: Condom  Other Topics Concern   Not on file  Social History Narrative   Lives with son and boyfriend   Son-Dawn Benjamin 22 this month 03/2019      Enjoys: shopping and spending time with girlfriends      Diet: was eating fried foods, but is working to [employer]- does like veggies and fruits   Caffeine: pepsi-20 oz; some sweet tea-weekly    Water: 3-4 bottles daily of 8 oz      Wears seat belt   Smoke detectors at home   Does not use phone while driving       Social Drivers of Health   Financial Resource Strain: Low Risk  (01/26/2020)   Overall Financial Resource Strain (CARDIA)    Difficulty of Paying Living Expenses: Not hard at all  Food Insecurity: No Food Insecurity (01/26/2020)   Hunger Vital Sign    Worried About Running Out of Food in the Last Year: Never true    Ran Out of Food in the Last Year: Never true  Transportation Needs: No Transportation Needs (01/26/2020)   PRAPARE - Administrator, Civil Service (Medical): No    Lack of Transportation (Non-Medical): No  Physical Activity: Insufficiently Active (01/26/2020)   Exercise Vital Sign    Days of Exercise per Week: 3 days    Minutes of Exercise per Session: 20 min  Stress: No Stress Concern Present (01/26/2020)   [employer] of Occupational Health - Occupational Stress Questionnaire    Feeling of Stress : Not at all  Social Connections: Socially Isolated (01/26/2020)   Social Connection and Isolation Panel [NHANES]    Frequency of Communication with Friends and Family: More than three  times a week    Frequency of Social Gatherings with Friends and Family: Three times a week    Attends Religious Services: Never    Active Member of Clubs or Organizations: No    Attends Banker Meetings: Never    Marital Status: Never married  Intimate Partner Violence: Not At Risk (01/26/2020)   Humiliation, Afraid, Rape, and Kick questionnaire    Fear of Current or Ex-Partner: No    Emotionally Abused: No    Physically Abused: No    Sexually Abused: No    Outpatient Medications Prior to Visit  Medication Sig Dispense Refill   Continuous Glucose Sensor (DEXCOM G7 SENSOR) MISC Inject 1 Application into the skin as directed. Change sensor every 10 days as directed. 9 each 3   Ferrous Sulfate (IRON) 325 (65 Fe) MG TABS Take 1 tablet (325 mg total) by mouth 2 (two) times daily with a meal. 60 tablet 3   glipiZIDE (GLUCOTROL XL) 5 MG 24 hr tablet Take 1 tablet  (5 mg total) by mouth daily with breakfast. 90 tablet 1   HUMALOG MIX 75/25 KWIKPEN (75-25) 100 UNIT/ML KwikPen Inject 15 Units into the skin 2 (two) times daily. 25 mL 3   hydrALAZINE (APRESOLINE) 25 MG tablet Take 1 tablet (25 mg total) by mouth in the morning and at bedtime. 90 tablet 1   labetalol (NORMODYNE) 200 MG tablet Take 2 tablets (400 mg total) by mouth 2 (two) times daily. 120 tablet 0   metFORMIN (GLUCOPHAGE) 500 MG tablet Take 1 tablet (500 mg total) by mouth 2 (two) times daily with a meal. 180 tablet 1   rosuvastatin (CRESTOR) 5 MG tablet Take 1 tablet (5 mg total) by mouth daily. 90 tablet 3   Vitamin D, Ergocalciferol, (DRISDOL) 1.25 MG (50000 UNIT) CAPS capsule Take 1 capsule (50,000 Units total) by mouth every 7 (seven) days. 20 capsule 1   olmesartan-hydrochlorothiazide (BENICAR HCT) 40-25 MG tablet Take 1 tablet by mouth once daily 30 tablet 0   No facility-administered medications prior to visit.    No Known Allergies  ROS Review of Systems  Constitutional:  Negative for chills and fever.  Eyes:  Negative for visual disturbance.  Respiratory:  Negative for chest tightness and shortness of breath.   Neurological:  Negative for dizziness and headaches.      Objective:    Physical Exam HENT:     Head: Normocephalic.     Mouth/Throat:     Mouth: Mucous membranes are moist.  Cardiovascular:     Rate and Rhythm: Normal rate.     Heart sounds: Normal heart sounds.  Pulmonary:     Effort: Pulmonary effort is normal.     Breath sounds: Normal breath sounds.  Neurological:     Mental Status: She is alert.     BP 138/84   Pulse 91   Resp 18   Ht 5\' 3"  (1.6 m)   Wt 173 lb 0.6 oz (78.5 kg)   SpO2 99%   BMI 30.65 kg/m  Wt Readings from Last 3 Encounters:  07/16/23 173 lb 0.6 oz (78.5 kg)  06/02/23 173 lb 12.8 oz (78.8 kg)  04/30/23 174 lb 1.9 oz (79 kg)    Lab Results  Component Value Date   TSH 1.730 04/22/2023   Lab Results  Component Value Date    WBC 6.0 04/22/2023   HGB 11.1 04/22/2023   HCT 32.8 (L) 04/22/2023   MCV 89 04/22/2023   PLT 293  04/22/2023   Lab Results  Component Value Date   NA 139 04/22/2023   K 4.5 04/22/2023   CO2 18 (L) 04/22/2023   GLUCOSE 178 (H) 04/22/2023   BUN 28 (H) 04/22/2023   CREATININE 1.38 (H) 04/22/2023   BILITOT <0.2 04/22/2023   ALKPHOS 147 (H) 04/22/2023   AST 13 04/22/2023   ALT 16 04/22/2023   PROT 6.8 04/22/2023   ALBUMIN 3.9 04/22/2023   CALCIUM 9.4 04/22/2023   ANIONGAP 10 07/25/2020   EGFR 49 (L) 04/22/2023   Lab Results  Component Value Date   CHOL 181 04/22/2023   Lab Results  Component Value Date   HDL 33 (L) 04/22/2023   Lab Results  Component Value Date   LDLCALC 121 (H) 04/22/2023   Lab Results  Component Value Date   TRIG 152 (H) 04/22/2023   Lab Results  Component Value Date   CHOLHDL 5.5 (H) 04/22/2023   Lab Results  Component Value Date   HGBA1C 7.7 (H) 04/22/2023      Assessment & Plan:  Primary hypertension Assessment & Plan: The patient's blood pressure is well controlled today in the clinic. She reports compliance with labetalol 400 mg twice daily and olmesartan-hydrochlorothiazide 40/25 mg daily. She denies headaches, dizziness, or blurred vision, and there are no reports of blood pressure readings below 90/60 mmHg. Encouraged the patient to continue her current treatment regimen. Recommended a low-sodium diet (<2,300 mg/day) and moderate-intensity physical activity for at least 150 minutes per week. Emphasized the importance of maintaining lifestyle modifications to support long-term blood pressure control. Discussed that uncontrolled hypertension increases the risk of cardiovascular complications, including stroke, coronary artery disease, and heart failure. The patient was advised to seek emergency care if blood pressure exceeds 180/120 mmHg and is accompanied by symptoms such as: Headache, Chest pain, Palpitations, Blurred vision,  Dizziness. The patient verbalized understanding and will follow up as scheduled.   Orders: -     Olmesartan Medoxomil-HCTZ; Take 1 tablet by mouth daily.  Dispense: 90 tablet; Refill: 2 -     BMP8+EGFR   Note: This chart has been completed using Engineer, civil (consulting) software, and while attempts have been made to ensure accuracy, certain words and phrases may not be transcribed as intended.   Follow-up: Return in about 4 months (around 11/15/2023).   Nicolle Heward, FNP

## 2023-07-16 NOTE — Assessment & Plan Note (Signed)
 The patient's blood pressure is well controlled today in the clinic. She reports compliance with labetalol 400 mg twice daily and olmesartan-hydrochlorothiazide 40/25 mg daily. She denies headaches, dizziness, or blurred vision, and there are no reports of blood pressure readings below 90/60 mmHg. Encouraged the patient to continue her current treatment regimen. Recommended a low-sodium diet (<2,300 mg/day) and moderate-intensity physical activity for at least 150 minutes per week. Emphasized the importance of maintaining lifestyle modifications to support long-term blood pressure control. Discussed that uncontrolled hypertension increases the risk of cardiovascular complications, including stroke, coronary artery disease, and heart failure. The patient was advised to seek emergency care if blood pressure exceeds 180/120 mmHg and is accompanied by symptoms such as: Headache, Chest pain, Palpitations, Blurred vision, Dizziness. The patient verbalized understanding and will follow up as scheduled.

## 2023-07-17 LAB — BMP8+EGFR
BUN/Creatinine Ratio: 14 (ref 9–23)
BUN: 21 mg/dL (ref 6–24)
CO2: 21 mmol/L (ref 20–29)
Calcium: 9.4 mg/dL (ref 8.7–10.2)
Chloride: 104 mmol/L (ref 96–106)
Creatinine, Ser: 1.46 mg/dL — ABNORMAL HIGH (ref 0.57–1.00)
Glucose: 170 mg/dL — ABNORMAL HIGH (ref 70–99)
Potassium: 4.3 mmol/L (ref 3.5–5.2)
Sodium: 139 mmol/L (ref 134–144)
eGFR: 46 mL/min/{1.73_m2} — ABNORMAL LOW (ref >59)

## 2023-07-21 ENCOUNTER — Other Ambulatory Visit: Payer: Self-pay | Admitting: Family Medicine

## 2023-07-21 DIAGNOSIS — I10 Essential (primary) hypertension: Secondary | ICD-10-CM

## 2023-07-22 ENCOUNTER — Telehealth: Payer: Self-pay

## 2023-07-30 ENCOUNTER — Telehealth: Payer: Self-pay | Admitting: Family Medicine

## 2023-07-30 NOTE — Telephone Encounter (Signed)
 Spoke to facility they will refax the form for completion.

## 2023-07-30 NOTE — Telephone Encounter (Signed)
 Copied from CRM [id number]. Topic: General - Other >> Jul 30, 2023 10:14 AM Crispin Dolphin wrote: Reason for CRM: [facility] called about form they received back that was not completed correctly. Needs to include the date of last pcp visit ( has to be within 90 days) and provider signature and date on 2nd page. Thank You

## 2023-07-31 NOTE — Telephone Encounter (Signed)
 Received [facility] forms again  Noted Copied Sleeved (put in provider box)

## 2023-08-04 ENCOUNTER — Other Ambulatory Visit: Payer: Self-pay | Admitting: Family Medicine

## 2023-08-05 ENCOUNTER — Telehealth: Payer: Self-pay | Admitting: Family Medicine

## 2023-08-05 NOTE — Telephone Encounter (Signed)
 Forms completed and faxed to [place] [facility] as of 08/04/2023

## 2023-08-05 NOTE — Telephone Encounter (Signed)
 Copied from CRM [id number]. Topic: General - Other >> Aug 05, 2023 10:45 AM Sophia H wrote: Reason for CRM: Spoke with Park Bolk from [facility] who states that the [facility] that was sent over was filled out incorrectly. Please re do and fax back out.

## 2023-08-06 NOTE — Telephone Encounter (Signed)
What was incorrect

## 2023-08-25 NOTE — Telephone Encounter (Signed)
 Chart noted in previous encounter.

## 2023-08-29 ENCOUNTER — Other Ambulatory Visit: Payer: Self-pay | Admitting: Nurse Practitioner

## 2023-08-31 ENCOUNTER — Other Ambulatory Visit: Payer: Self-pay | Admitting: Family Medicine

## 2023-08-31 DIAGNOSIS — I10 Essential (primary) hypertension: Secondary | ICD-10-CM

## 2023-09-03 ENCOUNTER — Ambulatory Visit: Admitting: Nurse Practitioner

## 2023-09-24 ENCOUNTER — Other Ambulatory Visit: Payer: Self-pay | Admitting: Family Medicine

## 2023-10-23 ENCOUNTER — Other Ambulatory Visit: Payer: Self-pay | Admitting: Family Medicine

## 2023-11-19 ENCOUNTER — Ambulatory Visit (INDEPENDENT_AMBULATORY_CARE_PROVIDER_SITE_OTHER): Admitting: Family Medicine

## 2023-11-19 ENCOUNTER — Telehealth: Payer: Self-pay | Admitting: Pharmacy Technician

## 2023-11-19 ENCOUNTER — Encounter: Payer: Self-pay | Admitting: Family Medicine

## 2023-11-19 ENCOUNTER — Other Ambulatory Visit (HOSPITAL_COMMUNITY): Payer: Self-pay

## 2023-11-19 VITALS — BP 168/92 | HR 88 | Ht 63.0 in | Wt 174.0 lb

## 2023-11-19 DIAGNOSIS — E1165 Type 2 diabetes mellitus with hyperglycemia: Secondary | ICD-10-CM | POA: Diagnosis not present

## 2023-11-19 DIAGNOSIS — I10 Essential (primary) hypertension: Secondary | ICD-10-CM

## 2023-11-19 DIAGNOSIS — R232 Flushing: Secondary | ICD-10-CM

## 2023-11-19 DIAGNOSIS — E7849 Other hyperlipidemia: Secondary | ICD-10-CM

## 2023-11-19 DIAGNOSIS — Z794 Long term (current) use of insulin: Secondary | ICD-10-CM | POA: Diagnosis not present

## 2023-11-19 DIAGNOSIS — E559 Vitamin D deficiency, unspecified: Secondary | ICD-10-CM

## 2023-11-19 DIAGNOSIS — E038 Other specified hypothyroidism: Secondary | ICD-10-CM

## 2023-11-19 MED ORDER — LABETALOL HCL 200 MG PO TABS: 400.0000 mg | ORAL_TABLET | Freq: Two times a day (BID) | ORAL | 3 refills | Status: DC

## 2023-11-19 MED ORDER — PAROXETINE MESYLATE 7.5 MG PO CAPS: 1.0000 | ORAL_CAPSULE | Freq: Every day | ORAL | 1 refills | Status: DC

## 2023-11-19 NOTE — Telephone Encounter (Signed)
 Pharmacy Patient Advocate Encounter   Received notification from CoverMyMeds that prior authorization for PARoxetine  Mesylate 7.5MG  capsules is required/requested.   Insurance verification completed.   The patient is insured through [employer] .   Per test claim:  FEMRING;ESTRADIOL PTWK;ELESTRIN is preferred by the insurance.  If suggested medication is appropriate, Please send in a new RX and discontinue this one. If not, please advise as to why it's not appropriate so that we may request a Prior Authorization. Please note, some preferred medications may still require a PA.  If the suggested medications have not been trialed and there are no contraindications to their use, the PA will not be submitted, as it will not be approved.

## 2023-11-19 NOTE — Assessment & Plan Note (Signed)
 Uncontrolled blood pressure in clinic today, likely related to being out of lisinopril  40 mg twice daily.  Refill for lisinopril  has been sent to the pharmacy, and the patient is encouraged to resume therapy.  Continue with: Hydralazine  25 mg daily Benicar -HCT 40/25 mg daily  Encouraged a low-sodium diet and increased physical activity as tolerated.  Monitor home blood pressure readings and bring log to follow-up appointment.  Educated on signs/symptoms requiring urgent care: BP >180/120 with headache, chest pain, vision changes, dizziness, or shortness of breath.

## 2023-11-19 NOTE — Assessment & Plan Note (Signed)
 Encouraged to start paroxetine  7.5 mg daily for management of hot flashes. Nonpharmacological Management: -Wear lightweight, breathable clothing. -Keep your environment cool (fans, open windows, or air conditioning). -Use layered clothing to remove items during a hot flash. -Hydration: Drink plenty of cool fluids throughout the day. -Dietary changes: Avoid known triggers such as spicy foods, caffeine, and alcohol, which may worsen hot flashes. -Stress reduction: Practice relaxation techniques such as deep breathing, yoga, meditation, or mindfulness. -Exercise: Engage in regular moderate-intensity exercise (150 minutes per week) to improve overall well-being and may reduce symptoms. -Smoking cessation: If applicable, as smoking can worsen vasomotor symptoms. -Sleep hygiene: Maintain a cool, dark sleeping environment and avoid heavy blankets at night.

## 2023-11-19 NOTE — Progress Notes (Signed)
 Established Patient Office Visit  Subjective:  Patient ID: AYN DOMANGUE, female    DOB: 06/21/1980  Age: 42 y.o. MRN: [id number]  CC:  Chief Complaint  Patient presents with   Hypertension    Has not had Labetalol  in ~2w, refill refused by office   Hot Flashes    Comes and goes; worse at night, also sweating    HPI Dawn Benjamin is a 43 y.o. female with a past medical history of hypertension and type II diabetes, presents today for follow-up of her chronic medical conditions. Dawn Benjamin reports experiencing hot flashes, which are particularly bothersome at nighttime and are associated with sweating.  Her blood pressure is elevated in the clinic today. Dawn Benjamin acknowledges that Dawn Benjamin has been out of her labetalol  for the past two weeks and is requesting refills today. Dawn Benjamin is currently asymptomatic, denying chest pain, palpitations, shortness of breath, dizziness, or visual changes.   Been out of labaeltol for two weeks      Whitney sept 2029 Past Medical History:  Diagnosis Date   Allergy    Anxiety    Diabetes mellitus without complication (HCC)    Hypertension    Menorrhagia 05/09/2019   Regular cycles    Past Surgical History:  Procedure Laterality Date   CHOLECYSTECTOMY     DENTAL SURGERY      Family History  Problem Relation Age of Onset   Hypertension Mother    Diabetes Mother    Cancer Mother        colon   Diabetes Paternal Aunt    Hypertension Paternal Aunt     Social History   Socioeconomic History   Marital status: Single    Spouse name: Not on file   Number of children: 1   Years of education: Not on file   Highest education level: 12th grade  Occupational History   Occupation: CNA     Comment: [employer]  Tobacco Use   Smoking status: Every Day    Current packs/day: 0.10    Types: Cigarettes   Smokeless tobacco: Never  Vaping Use   Vaping status: Never Used  Substance and Sexual Activity   Alcohol use: Yes   Drug use: No   Sexual  activity: Yes    Birth control/protection: Condom  Other Topics Concern   Not on file  Social History Narrative   Lives with son and boyfriend   Son-[name] 22 this month 03/2019      Enjoys: shopping and spending time with girlfriends      Diet: was eating fried foods, but is working to [employer]- does like veggies and fruits   Caffeine: pepsi-20 oz; some sweet tea-weekly    Water: 3-4 bottles daily of 8 oz      Wears seat belt   Smoke detectors at home   Does not use phone while driving      Social Drivers of Health   Financial Resource Strain: Low Risk  (01/26/2020)   Overall Financial Resource Strain (CARDIA)    Difficulty of Paying Living Expenses: Not hard at all  Food Insecurity: No Food Insecurity (01/26/2020)   Hunger Vital Sign    Worried About Running Out of Food in the Last Year: Never true    Ran Out of Food in the Last Year: Never true  Transportation Needs: No Transportation Needs (01/26/2020)   PRAPARE - Administrator, Civil Service (Medical): No    Lack of Transportation (Non-Medical): No  Physical  Activity: Insufficiently Active (01/26/2020)   Exercise Vital Sign    Days of Exercise per Week: 3 days    Minutes of Exercise per Session: 20 min  Stress: No Stress Concern Present (01/26/2020)   [employer] of Occupational Health - Occupational Stress Questionnaire    Feeling of Stress : Not at all  Social Connections: Socially Isolated (01/26/2020)   Social Connection and Isolation Panel    Frequency of Communication with Friends and Family: More than three times a week    Frequency of Social Gatherings with Friends and Family: Three times a week    Attends Religious Services: Never    Active Member of Clubs or Organizations: No    Attends Banker Meetings: Never    Marital Status: Never married  Intimate Partner Violence: Not At Risk (01/26/2020)   Humiliation, Afraid, Rape, and Kick questionnaire    Fear of Current or  Ex-Partner: No    Emotionally Abused: No    Physically Abused: No    Sexually Abused: No    Outpatient Medications Prior to Visit  Medication Sig Dispense Refill   Continuous Glucose Receiver (DEXCOM G7 RECEIVER) DEVI USE AS DIRECTED 1 each 0   Continuous Glucose Sensor (DEXCOM G7 SENSOR) MISC Inject 1 Application into the skin as directed. Change sensor every 10 days as directed. 9 each 3   Ferrous Sulfate (IRON ) 325 (65 Fe) MG TABS Take 1 tablet (325 mg total) by mouth 2 (two) times daily with a meal. 60 tablet 3   glipiZIDE  (GLUCOTROL  XL) 5 MG 24 hr tablet Take 1 tablet (5 mg total) by mouth daily with breakfast. 90 tablet 1   HUMALOG  MIX 75/25 KWIKPEN (75-25) 100 UNIT/ML KwikPen Inject 15 Units into the skin 2 (two) times daily. 25 mL 3   hydrALAZINE  (APRESOLINE ) 25 MG tablet TAKE 1 TABLET BY MOUTH IN THE MORNING AND AT BEDTIME 90 tablet 0   metFORMIN  (GLUCOPHAGE ) 500 MG tablet Take 1 tablet (500 mg total) by mouth 2 (two) times daily with a meal. 180 tablet 1   olmesartan -hydrochlorothiazide  (BENICAR  HCT) 40-25 MG tablet Take 1 tablet by mouth daily. 90 tablet 2   rosuvastatin  (CRESTOR ) 5 MG tablet Take 1 tablet (5 mg total) by mouth daily. 90 tablet 3   Vitamin D , Ergocalciferol , (DRISDOL ) 1.25 MG (50000 UNIT) CAPS capsule Take 1 capsule (50,000 Units total) by mouth every 7 (seven) days. 20 capsule 1   labetalol  (NORMODYNE ) 200 MG tablet Take 2 tablets by mouth twice daily (Patient not taking: Reported on 11/19/2023) 120 tablet 0   No facility-administered medications prior to visit.    No Known Allergies  ROS Review of Systems  Constitutional:  Negative for chills and fever.  Eyes:  Negative for visual disturbance.  Respiratory:  Negative for chest tightness and shortness of breath.   Neurological:  Negative for dizziness and headaches.      Objective:    Physical Exam HENT:     Head: Normocephalic.     Mouth/Throat:     Mouth: Mucous membranes are moist.   Cardiovascular:     Rate and Rhythm: Normal rate.     Heart sounds: Normal heart sounds.  Pulmonary:     Effort: Pulmonary effort is normal.     Breath sounds: Normal breath sounds.  Neurological:     Mental Status: Dawn Benjamin is alert.     BP (!) 168/92   Pulse 88   Ht 5' 3 (1.6 m)   Wt 174  lb (78.9 kg)   SpO2 97%   BMI 30.82 kg/m  Wt Readings from Last 3 Encounters:  11/19/23 174 lb (78.9 kg)  07/16/23 173 lb 0.6 oz (78.5 kg)  06/02/23 173 lb 12.8 oz (78.8 kg)    Lab Results  Component Value Date   TSH 1.730 04/22/2023   Lab Results  Component Value Date   WBC 6.0 04/22/2023   HGB 11.1 04/22/2023   HCT 32.8 (L) 04/22/2023   MCV 89 04/22/2023   PLT 293 04/22/2023   Lab Results  Component Value Date   NA 139 07/16/2023   K 4.3 07/16/2023   CO2 21 07/16/2023   GLUCOSE 170 (H) 07/16/2023   BUN 21 07/16/2023   CREATININE 1.46 (H) 07/16/2023   BILITOT <0.2 04/22/2023   ALKPHOS 147 (H) 04/22/2023   AST 13 04/22/2023   ALT 16 04/22/2023   PROT 6.8 04/22/2023   ALBUMIN 3.9 04/22/2023   CALCIUM  9.4 07/16/2023   ANIONGAP 10 07/25/2020   EGFR 46 (L) 07/16/2023   Lab Results  Component Value Date   CHOL 181 04/22/2023   Lab Results  Component Value Date   HDL 33 (L) 04/22/2023   Lab Results  Component Value Date   LDLCALC 121 (H) 04/22/2023   Lab Results  Component Value Date   TRIG 152 (H) 04/22/2023   Lab Results  Component Value Date   CHOLHDL 5.5 (H) 04/22/2023   Lab Results  Component Value Date   HGBA1C 7.7 (H) 04/22/2023      Assessment & Plan:  Primary hypertension Assessment & Plan: Uncontrolled blood pressure in clinic today, likely related to being out of lisinopril  40 mg twice daily.  Refill for lisinopril  has been sent to the pharmacy, and the patient is encouraged to resume therapy.  Continue with: Hydralazine  25 mg daily Benicar -HCT 40/25 mg daily  Encouraged a low-sodium diet and increased physical activity as  tolerated.  Monitor home blood pressure readings and bring log to follow-up appointment.  Educated on signs/symptoms requiring urgent care: BP >180/120 with headache, chest pain, vision changes, dizziness, or shortness of breath.   Orders: -     Labetalol  HCl; Take 2 tablets (400 mg total) by mouth 2 (two) times daily.  Dispense: 120 tablet; Refill: 3  Hot flashes Assessment & Plan: Encouraged to start paroxetine  7.5 mg daily for management of hot flashes. Nonpharmacological Management: -Wear lightweight, breathable clothing. -Keep your environment cool (fans, open windows, or air conditioning). -Use layered clothing to remove items during a hot flash. -Hydration: Drink plenty of cool fluids throughout the day. -Dietary changes: Avoid known triggers such as spicy foods, caffeine, and alcohol, which may worsen hot flashes. -Stress reduction: Practice relaxation techniques such as deep breathing, yoga, meditation, or mindfulness. -Exercise: Engage in regular moderate-intensity exercise (150 minutes per week) to improve overall well-being and may reduce symptoms. -Smoking cessation: If applicable, as smoking can worsen vasomotor symptoms. -Sleep hygiene: Maintain a cool, dark sleeping environment and avoid heavy blankets at night.  Orders: -     PARoxetine  Mesylate; Take 1 tablet by mouth daily.  Dispense: 30 capsule; Refill: 1  Type 2 diabetes mellitus with hyperglycemia, with long-term current use of insulin  (HCC) -     Hemoglobin A1c  Vitamin D  deficiency -     VITAMIN D  25 Hydroxy (Vit-D Deficiency, Fractures)  TSH (thyroid -stimulating hormone deficiency) -     TSH + free T4  Other hyperlipidemia -     Lipid panel -  CMP14+EGFR -     CBC with Differential/Platelet   Note: This chart has been completed using Engineer, civil (consulting) software, and while attempts have been made to ensure accuracy, certain words and phrases may not be transcribed as intended.   Follow-up:  Return in about 4 months (around 03/20/2024).   Okechukwu Regnier, FNP

## 2023-11-19 NOTE — Patient Instructions (Addendum)
 I appreciate the opportunity to provide care to you today!    Follow up:  4 months/ Nurse Visit in 4 weeks  Labs: please stop by the lab today/ during the week to get your blood drawn (CBC, CMP, TSH, Lipid profile, HgA1c, Vit D)  Hot Flashes -Start paroxetine  7.5 mg daily for management of hot flashes. Nonpharmacological Management: -Wear lightweight, breathable clothing. -Keep your environment cool (fans, open windows, or air conditioning). -Use layered clothing to remove items during a hot flash. -Hydration: Drink plenty of cool fluids throughout the day. -Dietary changes: Avoid known triggers such as spicy foods, caffeine, and alcohol, which may worsen hot flashes. -Stress reduction: Practice relaxation techniques such as deep breathing, yoga, meditation, or mindfulness. -Exercise: Engage in regular moderate-intensity exercise (150 minutes per week) to improve overall well-being and may reduce symptoms. -Smoking cessation: If applicable, as smoking can worsen vasomotor symptoms. -Sleep hygiene: Maintain a cool, dark sleeping environment and avoid heavy blankets at night.    Please follow up if your symptoms worsen or fail to improve.    Please continue to a heart-healthy diet and increase your physical activities. Try to exercise for at least five days a week.    It was a pleasure to see you and I look forward to continuing to work together on your health and well-being. Please do not hesitate to call the office if you need care or have questions about your care.  In case of emergency, please visit the Emergency Department for urgent care, or contact our clinic at [phone] to schedule an appointment. We're here to help you!   Have a wonderful day and week. With Gratitude, Desare Duddy MSN, FNP-BC

## 2023-11-20 ENCOUNTER — Other Ambulatory Visit: Payer: Self-pay | Admitting: Family Medicine

## 2023-11-20 DIAGNOSIS — R232 Flushing: Secondary | ICD-10-CM

## 2023-11-20 LAB — CBC WITH DIFFERENTIAL/PLATELET
Basophils Absolute: 0.1 x10E3/uL (ref 0.0–0.2)
Basos: 1 %
EOS (ABSOLUTE): 0.1 x10E3/uL (ref 0.0–0.4)
Eos: 2 %
Hematocrit: 32.8 % — ABNORMAL LOW (ref 34.0–46.6)
Hemoglobin: 10.7 g/dL — ABNORMAL LOW (ref 11.1–15.9)
Immature Grans (Abs): 0 x10E3/uL (ref 0.0–0.1)
Immature Granulocytes: 0 %
Lymphocytes Absolute: 2 x10E3/uL (ref 0.7–3.1)
Lymphs: 25 %
MCH: 29.1 pg (ref 26.6–33.0)
MCHC: 32.6 g/dL (ref 31.5–35.7)
MCV: 89 fL (ref 79–97)
Monocytes Absolute: 0.6 x10E3/uL (ref 0.1–0.9)
Monocytes: 7 %
Neutrophils Absolute: 5.2 x10E3/uL (ref 1.4–7.0)
Neutrophils: 65 %
Platelets: 302 x10E3/uL (ref 150–450)
RBC: 3.68 x10E6/uL — ABNORMAL LOW (ref 3.77–5.28)
RDW: 13.1 % (ref 11.7–15.4)
WBC: 7.9 x10E3/uL (ref 3.4–10.8)

## 2023-11-20 LAB — CMP14+EGFR
ALT: 11 IU/L (ref 0–32)
AST: 15 IU/L (ref 0–40)
Albumin: 4.2 g/dL (ref 3.9–4.9)
Alkaline Phosphatase: 113 IU/L (ref 44–121)
BUN/Creatinine Ratio: 12 (ref 9–23)
BUN: 18 mg/dL (ref 6–24)
Bilirubin Total: <0.2 mg/dL (ref 0.0–1.2)
CO2: 20 mmol/L (ref 20–29)
Calcium: 9.3 mg/dL (ref 8.7–10.2)
Chloride: 107 mmol/L — ABNORMAL HIGH (ref 96–106)
Creatinine, Ser: 1.45 mg/dL — ABNORMAL HIGH (ref 0.57–1.00)
Globulin, Total: 2.5 g/dL (ref 1.5–4.5)
Glucose: 85 mg/dL (ref 70–99)
Potassium: 3.9 mmol/L (ref 3.5–5.2)
Sodium: 141 mmol/L (ref 134–144)
Total Protein: 6.7 g/dL (ref 6.0–8.5)
eGFR: 46 mL/min/1.73 — ABNORMAL LOW (ref >59)

## 2023-11-20 LAB — TSH+FREE T4
Free T4: 1.27 ng/dL (ref 0.82–1.77)
TSH: 2.39 u[IU]/mL (ref 0.450–4.500)

## 2023-11-20 LAB — LIPID PANEL
Chol/HDL Ratio: 2.5 ratio (ref 0.0–4.4)
Cholesterol, Total: 119 mg/dL (ref 100–199)
HDL: 48 mg/dL (ref >39)
LDL Chol Calc (NIH): 57 mg/dL (ref 0–99)
Triglycerides: 68 mg/dL (ref 0–149)
VLDL Cholesterol Cal: 14 mg/dL (ref 5–40)

## 2023-11-20 LAB — HEMOGLOBIN A1C
Est. average glucose Bld gHb Est-mCnc: 120 mg/dL
Hgb A1c MFr Bld: 5.8 % — ABNORMAL HIGH (ref 4.8–5.6)

## 2023-11-20 LAB — VITAMIN D 25 HYDROXY (VIT D DEFICIENCY, FRACTURES): Vit D, 25-Hydroxy: 49.3 ng/mL (ref 30.0–100.0)

## 2023-11-20 MED ORDER — GABAPENTIN 300 MG PO CAPS
300.0000 mg | ORAL_CAPSULE | Freq: Every day | ORAL | 1 refills | Status: DC
Start: 2023-11-20 — End: 2024-02-19

## 2023-11-20 NOTE — Progress Notes (Unsigned)
 ga

## 2023-11-20 NOTE — Telephone Encounter (Signed)
 Please inform the patient that a prescription for gabapentin  300 mg to be taken at bedtime has been sent to her pharmacy to help manage her hot flashes

## 2023-11-23 ENCOUNTER — Ambulatory Visit: Payer: Self-pay | Admitting: Family Medicine

## 2023-11-23 NOTE — Progress Notes (Signed)
 Please inform the patient that her hemoglobin A1c has decreased to 5.8, indicating adequate glycemic control. I recommend she continue her current treatment regimen while decreasing her intake of high-sugar foods and beverages and increasing physical activity. I also recommend increasing her fluid intake to at least 64 ounces daily. Her hemoglobin and hematocrit levels are slightly low, and I recommend increasing her intake of iron -rich foods such as spinach, beans, lentils, fortified cereals, lean red meat, poultry, and fish.

## 2023-11-24 ENCOUNTER — Other Ambulatory Visit: Payer: Self-pay | Admitting: Nurse Practitioner

## 2023-11-27 NOTE — Telephone Encounter (Signed)
 Patient aware

## 2023-12-07 ENCOUNTER — Other Ambulatory Visit (HOSPITAL_COMMUNITY): Payer: Self-pay

## 2023-12-10 ENCOUNTER — Encounter: Payer: Self-pay | Admitting: Nurse Practitioner

## 2023-12-10 ENCOUNTER — Ambulatory Visit (INDEPENDENT_AMBULATORY_CARE_PROVIDER_SITE_OTHER): Admitting: Nurse Practitioner

## 2023-12-10 VITALS — BP 142/90 | HR 86 | Ht 63.0 in | Wt 170.8 lb

## 2023-12-10 DIAGNOSIS — E782 Mixed hyperlipidemia: Secondary | ICD-10-CM

## 2023-12-10 DIAGNOSIS — Z794 Long term (current) use of insulin: Secondary | ICD-10-CM

## 2023-12-10 DIAGNOSIS — I10 Essential (primary) hypertension: Secondary | ICD-10-CM

## 2023-12-10 DIAGNOSIS — E1122 Type 2 diabetes mellitus with diabetic chronic kidney disease: Secondary | ICD-10-CM | POA: Diagnosis not present

## 2023-12-10 DIAGNOSIS — N1831 Chronic kidney disease, stage 3a: Secondary | ICD-10-CM

## 2023-12-10 DIAGNOSIS — E1165 Type 2 diabetes mellitus with hyperglycemia: Secondary | ICD-10-CM

## 2023-12-10 DIAGNOSIS — Z7984 Long term (current) use of oral hypoglycemic drugs: Secondary | ICD-10-CM | POA: Diagnosis not present

## 2023-12-10 MED ORDER — METFORMIN HCL 500 MG PO TABS: 500.0000 mg | ORAL_TABLET | Freq: Two times a day (BID) | ORAL | 1 refills | Status: DC

## 2023-12-10 MED ORDER — HUMALOG MIX 75/25 KWIKPEN (75-25) 100 UNIT/ML ~~LOC~~ SUPN: 15.0000 [IU] | PEN_INJECTOR | Freq: Two times a day (BID) | SUBCUTANEOUS | 3 refills | Status: DC

## 2023-12-10 NOTE — Progress Notes (Signed)
 Endocrinology Follow Up Note       12/10/2023, 12:22 PM   Subjective:    Patient ID: Dawn Benjamin, female    DOB: 10/03/1980.  Dawn Benjamin is being seen in follow up after being seen in consultation for management of currently uncontrolled symptomatic diabetes requested by  Zarwolo, Gloria, FNP.   Past Medical History:  Diagnosis Date   Allergy    Anxiety    Diabetes mellitus without complication (HCC)    Hypertension    Menorrhagia 05/09/2019   Regular cycles    Past Surgical History:  Procedure Laterality Date   CHOLECYSTECTOMY     DENTAL SURGERY      Social History   Socioeconomic History   Marital status: Single    Spouse name: Not on file   Number of children: 1   Years of education: Not on file   Highest education level: 12th grade  Occupational History   Occupation: CNA     Comment: [employer]  Tobacco Use   Smoking status: Some Days    Current packs/day: 0.10    Types: Cigarettes   Smokeless tobacco: Never  Vaping Use   Vaping status: Never Used  Substance and Sexual Activity   Alcohol use: Yes   Drug use: No   Sexual activity: Yes    Birth control/protection: Condom  Other Topics Concern   Not on file  Social History Narrative   Lives with son and boyfriend   Son-[name] 22 this month 03/2019      Enjoys: shopping and spending time with girlfriends      Diet: was eating fried foods, but is working to [employer]- does like veggies and fruits   Caffeine: pepsi-20 oz; some sweet tea-weekly    Water: 3-4 bottles daily of 8 oz      Wears seat belt   Smoke detectors at home   Does not use phone while driving      Social Drivers of Health   Financial Resource Strain: Low Risk  (01/26/2020)   Overall Financial Resource Strain (CARDIA)    Difficulty of Paying Living Expenses: Not hard at all  Food Insecurity: No Food Insecurity (01/26/2020)   Hunger Vital  Sign    Worried About Running Out of Food in the Last Year: Never true    Ran Out of Food in the Last Year: Never true  Transportation Needs: No Transportation Needs (01/26/2020)   PRAPARE - Administrator, Civil Service (Medical): No    Lack of Transportation (Non-Medical): No  Physical Activity: Insufficiently Active (01/26/2020)   Exercise Vital Sign    Days of Exercise per Week: 3 days    Minutes of Exercise per Session: 20 min  Stress: No Stress Concern Present (01/26/2020)   [employer] of Occupational Health - Occupational Stress Questionnaire    Feeling of Stress : Not at all  Social Connections: Socially Isolated (01/26/2020)   Social Connection and Isolation Panel    Frequency of Communication with Friends and Family: More than three times a week    Frequency of Social Gatherings with Friends and Family: Three times a week    Attends Religious Services:  Never    Active Member of Clubs or Organizations: No    Attends Banker Meetings: Never    Marital Status: Never married    Family History  Problem Relation Age of Onset   Hypertension Mother    Diabetes Mother    Cancer Mother        colon   Diabetes Paternal Aunt    Hypertension Paternal Aunt     Outpatient Encounter Medications as of 12/10/2023  Medication Sig   Continuous Glucose Receiver (DEXCOM G7 RECEIVER) DEVI USE AS DIRECTED   Continuous Glucose Sensor (DEXCOM G7 SENSOR) MISC Inject 1 Application into the skin as directed. Change sensor every 10 days as directed.   Ferrous Sulfate (IRON ) 325 (65 Fe) MG TABS Take 1 tablet (325 mg total) by mouth 2 (two) times daily with a meal. (Patient taking differently: Take 325 mg by mouth daily.)   hydrALAZINE  (APRESOLINE ) 25 MG tablet TAKE 1 TABLET BY MOUTH IN THE MORNING AND AT BEDTIME   labetalol  (NORMODYNE ) 200 MG tablet Take 2 tablets (400 mg total) by mouth 2 (two) times daily.   olmesartan -hydrochlorothiazide  (BENICAR  HCT) 40-25 MG  tablet Take 1 tablet by mouth daily.   rosuvastatin  (CRESTOR ) 5 MG tablet Take 1 tablet (5 mg total) by mouth daily.   Vitamin D , Ergocalciferol , (DRISDOL ) 1.25 MG (50000 UNIT) CAPS capsule Take 1 capsule (50,000 Units total) by mouth every 7 (seven) days.   [DISCONTINUED] glipiZIDE  (GLUCOTROL  XL) 5 MG 24 hr tablet Take 1 tablet by mouth once daily with breakfast   [DISCONTINUED] HUMALOG  MIX 75/25 KWIKPEN (75-25) 100 UNIT/ML KwikPen Inject 15 Units into the skin 2 (two) times daily. (Patient taking differently: Inject 12 Units into the skin 2 (two) times daily.)   [DISCONTINUED] metFORMIN  (GLUCOPHAGE ) 500 MG tablet Take 1 tablet (500 mg total) by mouth 2 (two) times daily with a meal.   gabapentin  (NEURONTIN ) 300 MG capsule Take 1 capsule (300 mg total) by mouth at bedtime. (Patient not taking: Reported on 12/10/2023)   HUMALOG  MIX 75/25 KWIKPEN (75-25) 100 UNIT/ML KwikPen Inject 15 Units into the skin 2 (two) times daily.   metFORMIN  (GLUCOPHAGE ) 500 MG tablet Take 1 tablet (500 mg total) by mouth 2 (two) times daily with a meal.   PARoxetine  Mesylate 7.5 MG CAPS Take 1 tablet by mouth daily. (Patient not taking: Reported on 12/10/2023)   No facility-administered encounter medications on file as of 12/10/2023.    ALLERGIES: No Known Allergies  VACCINATION STATUS:  There is no immunization history on file for this patient.  Diabetes She presents for her follow-up diabetic visit. She has type 2 diabetes mellitus. Onset time: diagnosed at approx age of 75. Her disease course has been improving. Hypoglycemia symptoms include nervousness/anxiousness, sweats and tremors. Hypoglycemia complications include nocturnal hypoglycemia. 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 Risk factors for coronary artery disease include diabetes mellitus, dyslipidemia, family history, hypertension and sedentary lifestyle. Current diabetic treatment includes insulin  injections and oral  agent (dual therapy). She is compliant with treatment most of the time. Her weight is fluctuating minimally. She is following a generally unhealthy diet. When asked about meal planning, she reported none. She has not had a previous visit with a dietitian. She rarely participates in exercise. Her home blood glucose trend is decreasing steadily. Her overall blood glucose range is 110-130 mg/dl. (She presents today, with her CGM showing at target glycemic profile overall.  Her most recent A1c was 5.8%, on 8/21, improving from last  A1c of 7.7%.  Analysis of her CGM shows TIR 92%, TAR 3%, TBR 5% with a GMI of 6.2%.  She did cut her insulin  down some to avoid hypoglycemia.) An ACE inhibitor/angiotensin II receptor blocker is being taken. She does not see a podiatrist.Eye exam is current.     Review of systems  Constitutional: + Minimally fluctuating body weight, current Body mass index is 30.26 kg/m., no fatigue, no subjective hyperthermia, no subjective hypothermia Eyes: + legally blind ENT: no sore throat, no nodules palpated in throat, no dysphagia/odynophagia, no hoarseness Cardiovascular: no chest pain, no shortness of breath, no palpitations, no leg swelling Respiratory: no cough, no shortness of breath Gastrointestinal: no nausea/vomiting/diarrhea Musculoskeletal: no muscle/joint aches Skin: no rashes, no hyperemia Neurological: no tremors, no numbness, no tingling, no dizziness Psychiatric: no depression, no anxiety  Objective:     BP (!) 142/90 (BP Location: Right Arm, Patient Position: Sitting) Comment: Retake BP - patient states that she just took her BP medication. She has a followup with PCP 12/16/23 for BP check.  Pulse 86   Ht 5' 3 (1.6 m)   Wt 170 lb 12.8 oz (77.5 kg)   LMP 11/27/2023 (Approximate)   BMI 30.26 kg/m   Wt Readings from Last 3 Encounters:  12/10/23 170 lb 12.8 oz (77.5 kg)  11/19/23 174 lb (78.9 kg)  07/16/23 173 lb 0.6 oz (78.5 kg)     BP Readings from Last  3 Encounters:  12/10/23 (!) 142/90  11/19/23 (!) 168/92  07/16/23 138/84      Physical Exam- Limited  Constitutional:  Body mass index is 30.26 kg/m. , not in acute distress, normal state of mind Eyes:  EOMI, no exophthalmos Musculoskeletal: no gross deformities, strength intact in all four extremities, no gross restriction of joint movements Skin:  no rashes, no hyperemia Neurological: no tremor with outstretched hands   Diabetic Foot Exam - Simple   Simple Foot Form Diabetic Foot exam was performed with the following findings: Yes 12/10/2023 11:48 AM  Visual Inspection No deformities, no ulcerations, no other skin breakdown bilaterally: Yes Sensation Testing Intact to touch and monofilament testing bilaterally: Yes Pulse Check Posterior Tibialis and Dorsalis pulse intact bilaterally: Yes Comments      CMP ( most recent) CMP     Component Value Date/Time   NA 141 11/19/2023 0916   K 3.9 11/19/2023 0916   CL 107 (H) 11/19/2023 0916   CO2 20 11/19/2023 0916   GLUCOSE 85 11/19/2023 0916   GLUCOSE 455 (H) 07/25/2020 0849   BUN 18 11/19/2023 0916   CREATININE 1.45 (H) 11/19/2023 0916   CREATININE 0.83 03/09/2019 1018   CALCIUM  9.3 11/19/2023 0916   PROT 6.7 11/19/2023 0916   ALBUMIN 4.2 11/19/2023 0916   AST 15 11/19/2023 0916   ALT 11 11/19/2023 0916   ALKPHOS 113 11/19/2023 0916   BILITOT <0.2 11/19/2023 0916   EGFR 46 (L) 11/19/2023 0916   GFRNONAA >60 07/25/2020 0849   GFRNONAA 89 03/09/2019 1018     Diabetic Labs (most recent): Lab Results  Component Value Date   HGBA1C 5.8 (H) 11/19/2023   HGBA1C 7.7 (H) 04/22/2023   HGBA1C 14.0 (A) 01/26/2020     Lipid Panel ( most recent) Lipid Panel     Component Value Date/Time   CHOL 119 11/19/2023 0916   TRIG 68 11/19/2023 0916   HDL 48 11/19/2023 0916   CHOLHDL 2.5 11/19/2023 0916   CHOLHDL 4.5 03/09/2019 1018   LDLCALC 57 11/19/2023 0916  LDLCALC 131 (H) 03/09/2019 1018   LABVLDL 14 11/19/2023 0916       Lab Results  Component Value Date   TSH 2.390 11/19/2023   TSH 1.730 04/22/2023   TSH 1.430 02/01/2020   TSH 1.65 03/09/2019   FREET4 1.27 11/19/2023   FREET4 1.39 04/22/2023           Assessment & Plan:   1) Type 2 diabetes mellitus with stage 3a chronic kidney disease, with long-term current use of insulin  (HCC) (Primary)  She presents today, with her CGM showing at target glycemic profile overall.  Her most recent A1c was 5.8%, on 8/21, improving from last A1c of 7.7%.  Analysis of her CGM shows TIR 92%, TAR 3%, TBR 5% with a GMI of 6.2%.  She did cut her insulin  down some to avoid hypoglycemia.  - Dawn Benjamin has currently uncontrolled symptomatic type 2 DM since 43 years of age.   -Recent labs reviewed.  - I had a long discussion with her about the progressive nature of diabetes and the pathology behind its complications. -her diabetes is complicated by CKD stage 3a , and retinopathy (causing blindness), and she remains at a high risk for more acute and chronic complications which include CAD, CVA, CKD, retinopathy, and neuropathy. These are all discussed in detail with her.  The following Lifestyle Medicine recommendations according to American College of Lifestyle Medicine [facility]) were discussed and offered to patient and she agrees to start the journey:  A. Whole Foods, Plant-based plate comprising of fruits and vegetables, plant-based proteins, whole-grain carbohydrates was discussed in detail with the patient.   A list for source of those nutrients were also provided to the patient.  Patient will use only water or unsweetened tea for hydration. B.  The need to stay away from risky substances including alcohol, smoking; obtaining 7 to 9 hours of restorative sleep, at least 150 minutes of moderate intensity exercise weekly, the importance of healthy social connections,  and stress reduction techniques were discussed. C.  A full color page of Calorie density of  various food groups per pound showing examples of each food groups was provided to the patient.  - Nutritional counseling repeated at each appointment due to patients tendency to fall back in to old habits.  - The patient admits there is a room for improvement in their diet and drink choices. -  Suggestion is made for the patient to avoid simple carbohydrates from their diet including Cakes, Sweet Desserts / Pastries, Ice Cream, Soda (diet and regular), Sweet Tea, Candies, Chips, Cookies, Sweet Pastries, Store Bought Juices, Alcohol in Excess of 1-2 drinks a day, Artificial Sweeteners, Coffee Creamer, and Sugar-free Products. This will help patient to have stable blood glucose profile and potentially avoid unintended weight gain.   - I encouraged the patient to switch to unprocessed or minimally processed complex starch and increased protein intake (animal or plant source), fruits, and vegetables.   - Patient is advised to stick to a routine mealtimes to eat 3 meals a day and avoid unnecessary snacks (to snack only to correct hypoglycemia).  - I have approached her with the following individualized plan to manage her diabetes and patient agrees:   -She is advised to continue Humalog  75/25 12 units SQ BID with breakfast and supper if glucose is above 90 and she is eating.  She can also continue Metformin  500 mg po twice daily but is advised to stop the Glipizide  due to recent hypoglycemia.  -she  is encouraged to start/continue monitoring glucose 4 times daily (using her CGM), before meals and before bed, and to call the clinic if she has readings less than 70 or above 300 for 3 tests in a row.  - she is warned not to take insulin  without proper monitoring per orders. - Adjustment parameters are given to her for hypo and hyperglycemia in writing.   she is not a candidate for full dose Metformin  due to concurrent renal insufficiency.  - she will be considered for incretin therapy as appropriate  next visit.  - Specific targets for  A1c; LDL, HDL, and Triglycerides were discussed with the patient.  2) Blood Pressure /Hypertension:  her blood pressure is controlled to target.   she is advised to continue her current medications as prescribed by PCP.  3) Lipids/Hyperlipidemia:    Review of her recent lipid panel from 04/22/23 showed uncontrolled LDL at 121 and slightly elevated triglycerides of 152.  she is advised to continue Crestor  5 mg daily at bedtime.  Side effects and precautions discussed with her.  4)  Weight/Diet:  her Body mass index is 30.26 kg/m.  -  clearly complicating her diabetes care.   she is a candidate for weight loss. I discussed with her the fact that loss of 5 - 10% of her  current body weight will have the most impact on her diabetes management.  Exercise, and detailed carbohydrates information provided  -  detailed on discharge instructions.  5) Chronic Care/Health Maintenance: -she is on ACEI/ARB and Statin medications and is encouraged to initiate and continue to follow up with Ophthalmology, Dentist, Podiatrist at least yearly or according to recommendations, and advised to stay away from smoking. I have recommended yearly flu vaccine and pneumonia vaccine at least every 5 years; moderate intensity exercise for up to 150 minutes weekly; and sleep for at least 7 hours a day.  - she is advised to maintain close follow up with Zarwolo, Gloria, FNP for primary care needs, as well as her other providers for optimal and coordinated care.     I spent  43  minutes in the care of the patient today including review of labs from CMP, Lipids, Thyroid  Function, Hematology (current and previous including abstractions from other facilities); face-to-face time discussing  her blood glucose readings/logs, discussing hypoglycemia and hyperglycemia episodes and symptoms, medications doses, her options of short and long term treatment based on the latest standards of care /  guidelines;  discussion about incorporating lifestyle medicine;  and documenting the encounter. Risk reduction counseling performed per USPSTF guidelines to reduce obesity and cardiovascular risk factors.     Please refer to Patient Instructions for Blood Glucose Monitoring and Insulin /Medications Dosing Guide  in media tab for additional information. Please  also refer to  Patient Self Inventory in the Media  tab for reviewed elements of pertinent patient history.  Dawn Benjamin Marc participated in the discussions, expressed understanding, and voiced agreement with the above plans.  All questions were answered to her satisfaction. she is encouraged to contact clinic should she have any questions or concerns prior to her return visit.     Follow up plan: - Return in about 4 months (around 04/10/2024) for Diabetes F/U with A1c in office, No previsit labs, Bring meter and logs.   Benton Rio, [facility] [address] [place], [place] [address] Phone: [phone] Fax: [phone]  12/10/2023, 12:22 PM

## 2023-12-16 ENCOUNTER — Ambulatory Visit

## 2023-12-22 ENCOUNTER — Ambulatory Visit (INDEPENDENT_AMBULATORY_CARE_PROVIDER_SITE_OTHER)

## 2023-12-22 DIAGNOSIS — I10 Essential (primary) hypertension: Secondary | ICD-10-CM | POA: Diagnosis not present

## 2023-12-22 NOTE — Progress Notes (Signed)
 Patient is in office today for a nurse visit for Blood Pressure Check. Patient blood pressure was 177/98, Patient No chest pain, No shortness of breath, No dyspnea on exertion, No orthopnea, No paroxysmal nocturnal dyspnea, No edema, No palpitations, No syncope. Patient reported she did not take any blood pressure medications before coming in for nurse visit.

## 2024-01-05 DIAGNOSIS — E113593 Type 2 diabetes mellitus with proliferative diabetic retinopathy without macular edema, bilateral: Secondary | ICD-10-CM | POA: Diagnosis not present

## 2024-01-05 DIAGNOSIS — Z8669 Personal history of other diseases of the nervous system and sense organs: Secondary | ICD-10-CM | POA: Diagnosis not present

## 2024-01-05 DIAGNOSIS — H33053 Total retinal detachment, bilateral: Secondary | ICD-10-CM | POA: Diagnosis not present

## 2024-01-05 DIAGNOSIS — H4311 Vitreous hemorrhage, right eye: Secondary | ICD-10-CM | POA: Diagnosis not present

## 2024-02-03 NOTE — Progress Notes (Signed)
 JANECIA PALAU                                          MRN: [id number]   02/03/2024   The [facility] Quality Team Specialist reviewed this patient medical record for the purposes of chart review for care gap closure. The following were reviewed: chart review for care gap closure-kidney health evaluation for diabetes:eGFR  and uACR.    [facility]BCI Quality Team

## 2024-02-19 ENCOUNTER — Other Ambulatory Visit: Payer: Self-pay | Admitting: Family Medicine

## 2024-02-19 ENCOUNTER — Other Ambulatory Visit: Payer: Self-pay | Admitting: Nurse Practitioner

## 2024-02-19 DIAGNOSIS — I10 Essential (primary) hypertension: Secondary | ICD-10-CM

## 2024-02-19 DIAGNOSIS — E1165 Type 2 diabetes mellitus with hyperglycemia: Secondary | ICD-10-CM

## 2024-02-19 DIAGNOSIS — R232 Flushing: Secondary | ICD-10-CM

## 2024-02-20 ENCOUNTER — Other Ambulatory Visit: Payer: Self-pay | Admitting: Nurse Practitioner

## 2024-02-23 ENCOUNTER — Other Ambulatory Visit: Payer: Self-pay | Admitting: Family Medicine

## 2024-02-23 DIAGNOSIS — E559 Vitamin D deficiency, unspecified: Secondary | ICD-10-CM

## 2024-02-23 DIAGNOSIS — I10 Essential (primary) hypertension: Secondary | ICD-10-CM

## 2024-02-23 MED ORDER — OLMESARTAN MEDOXOMIL-HCTZ 40-25 MG PO TABS: 1.0000 | ORAL_TABLET | Freq: Every day | ORAL | 2 refills | Status: AC

## 2024-02-23 MED ORDER — VITAMIN D (ERGOCALCIFEROL) 1.25 MG (50000 UNIT) PO CAPS: 50000.0000 [IU] | ORAL_CAPSULE | ORAL | 1 refills | Status: AC

## 2024-02-23 MED ORDER — LABETALOL HCL 200 MG PO TABS: 400.0000 mg | ORAL_TABLET | Freq: Two times a day (BID) | ORAL | 3 refills | Status: DC

## 2024-02-23 NOTE — Telephone Encounter (Signed)
 Copied from CRM [id number]. Topic: Clinical - Medication Refill >> Feb 23, 2024 12:19 PM Carla L wrote: Medication: labetalol  (NORMODYNE ) 200 MG tablet olmesartan -hydrochlorothiazide  (BENICAR  HCT) 40-25 MG tablet rosuvastatin  (CRESTOR ) 5 MG tablet Vitamin D , Ergocalciferol , (DRISDOL ) 1.25 MG (50000 UNIT) CAPS capsule Requesting refills to be sent to pharmacy below.  Has the patient contacted their pharmacy? Yes Pharmacy calling on behalf of patient.   This is the patient's preferred pharmacy:  [facility] - [place], [place] - [address] [place] [address] Phone: [phone] Fax: [phone]  Is this the correct pharmacy for this prescription? Yes If no, delete pharmacy and type the correct one.   Has the prescription been filled recently? No  Is the patient out of the medication? Unsure  Has the patient been seen for an appointment in the last year OR does the patient have an upcoming appointment? Yes  Can we respond through MyChart? No  Agent: Please be advised that Rx refills may take up to 3 business days. We ask that you follow-up with your pharmacy.

## 2024-02-24 ENCOUNTER — Other Ambulatory Visit: Payer: Self-pay | Admitting: Family Medicine

## 2024-02-24 ENCOUNTER — Telehealth: Payer: Self-pay

## 2024-02-24 ENCOUNTER — Telehealth: Payer: Self-pay | Admitting: Family Medicine

## 2024-02-24 DIAGNOSIS — E1165 Type 2 diabetes mellitus with hyperglycemia: Secondary | ICD-10-CM

## 2024-02-24 DIAGNOSIS — E782 Mixed hyperlipidemia: Secondary | ICD-10-CM

## 2024-02-24 NOTE — Telephone Encounter (Signed)
 Copied from CRM [id number]. Topic: Clinical - Medication Question >> Feb 24, 2024 12:24 PM Santiya F wrote: Reason for CRM: Patient is calling in because she was told by the pharmacy that her prescriptions had been changed and she needed to speak with her pcp's office regarding the changes. Patient wants to know why the medication had been changed. Please advise. Vitamin D , Ergocalciferol , (DRISDOL ) 1.25 MG (50000 UNIT) CAPS capsule [[id number]], olmesartan -hydrochlorothiazide  (BENICAR  HCT) 40-25 MG tablet [[id number]]

## 2024-02-24 NOTE — Telephone Encounter (Unsigned)
 Copied from CRM [id number]. Topic: General - Other >> Feb 24, 2024 12:08 PM Tiffini S wrote: Reason for CRM: [place] with [facility] called about request that was faxed over for olmesartan -hydrochlorothiazide  (BENICAR  HCT) 40-25 MG tablet- patient was told that she is taking Olmesartan  25mg  tablets  Vitamin D , Ergocalciferol , (DRISDOL ) 1.25 MG (50000 UNIT) CAPS capsule- patient was told that she is taking Vitamin D  2  Rosuvastatin  (CRESTOR ) 5 MG tablet- need a prescription for the medication   The pharmacy is asking for a call back/ need clarification [phone] reference number is [phone]

## 2024-02-24 NOTE — Telephone Encounter (Signed)
 Left detailed vm

## 2024-02-24 NOTE — Telephone Encounter (Signed)
 Rx are the same strength as the last refill

## 2024-02-28 ENCOUNTER — Other Ambulatory Visit: Payer: Self-pay | Admitting: Nurse Practitioner

## 2024-02-28 DIAGNOSIS — E1165 Type 2 diabetes mellitus with hyperglycemia: Secondary | ICD-10-CM

## 2024-03-01 ENCOUNTER — Other Ambulatory Visit: Payer: Self-pay | Admitting: Nurse Practitioner

## 2024-03-17 ENCOUNTER — Ambulatory Visit: Admitting: Family Medicine

## 2024-03-23 ENCOUNTER — Other Ambulatory Visit: Payer: Self-pay | Admitting: Nurse Practitioner

## 2024-04-12 ENCOUNTER — Encounter: Payer: Self-pay | Admitting: Nurse Practitioner

## 2024-04-12 ENCOUNTER — Ambulatory Visit: Admitting: Nurse Practitioner

## 2024-04-12 VITALS — BP 138/78 | HR 100 | Ht 63.0 in | Wt 172.0 lb

## 2024-04-12 DIAGNOSIS — Z794 Long term (current) use of insulin: Secondary | ICD-10-CM | POA: Diagnosis not present

## 2024-04-12 DIAGNOSIS — Z7984 Long term (current) use of oral hypoglycemic drugs: Secondary | ICD-10-CM | POA: Diagnosis not present

## 2024-04-12 DIAGNOSIS — E1165 Type 2 diabetes mellitus with hyperglycemia: Secondary | ICD-10-CM | POA: Diagnosis not present

## 2024-04-12 DIAGNOSIS — I10 Essential (primary) hypertension: Secondary | ICD-10-CM

## 2024-04-12 DIAGNOSIS — E1122 Type 2 diabetes mellitus with diabetic chronic kidney disease: Secondary | ICD-10-CM | POA: Diagnosis not present

## 2024-04-12 DIAGNOSIS — E782 Mixed hyperlipidemia: Secondary | ICD-10-CM | POA: Diagnosis not present

## 2024-04-12 DIAGNOSIS — N1831 Chronic kidney disease, stage 3a: Secondary | ICD-10-CM

## 2024-04-12 LAB — POCT GLYCOSYLATED HEMOGLOBIN (HGB A1C): Hemoglobin A1C: 6.8 % — AB (ref 4.0–5.6)

## 2024-04-12 MED ORDER — METFORMIN HCL 500 MG PO TABS: 500.0000 mg | ORAL_TABLET | Freq: Two times a day (BID) | ORAL | 0 refills | Status: AC

## 2024-04-12 MED ORDER — DEXCOM G7 SENSOR MISC: 1.0000 | 3 refills | Status: AC

## 2024-04-12 MED ORDER — INSULIN LISPRO PROT & LISPRO (75-25 MIX) 100 UNIT/ML KWIKPEN: 15.0000 [IU] | PEN_INJECTOR | Freq: Two times a day (BID) | SUBCUTANEOUS | 3 refills | Status: AC

## 2024-04-12 NOTE — Progress Notes (Signed)
 "                                                                        Endocrinology Follow Up Note       04/12/2024, 11:13 AM   Subjective:    Patient ID: Dawn Benjamin, female    DOB: 1980-06-19.  Dawn Benjamin is being seen in follow up after being seen in consultation for management of currently uncontrolled symptomatic diabetes requested by  Edman Meade PEDLAR, FNP.   Past Medical History:  Diagnosis Date   Allergy    Anxiety    Diabetes mellitus without complication (HCC)    Hypertension    Menorrhagia 05/09/2019   Regular cycles    Past Surgical History:  Procedure Laterality Date   CHOLECYSTECTOMY     DENTAL SURGERY      Social History   Socioeconomic History   Marital status: Single    Spouse name: Not on file   Number of children: 1   Years of education: Not on file   Highest education level: 12th grade  Occupational History   Occupation: CNA     Comment: [employer]  Tobacco Use   Smoking status: Some Days    Current packs/day: 0.10    Types: Cigarettes   Smokeless tobacco: Never  Vaping Use   Vaping status: Never Used  Substance and Sexual Activity   Alcohol use: Yes   Drug use: No   Sexual activity: Yes    Birth control/protection: Condom  Other Topics Concern   Not on file  Social History Narrative   Lives with son and boyfriend   Son-[name] 22 this month 03/2019      Enjoys: shopping and spending time with girlfriends      Diet: was eating fried foods, but is working to [employer]- does like veggies and fruits   Caffeine: pepsi-20 oz; some sweet tea-weekly    Water: 3-4 bottles daily of 8 oz      Wears seat belt   Smoke detectors at home   Does not use phone while driving      Social Drivers of Health   Tobacco Use: High Risk (04/12/2024)   Patient History    Smoking Tobacco Use: Some Days    Smokeless Tobacco Use: Never    Passive Exposure: Not on file  Financial Resource Strain: Not on file  Food Insecurity: Not on  file  Transportation Needs: Not on file  Physical Activity: Not on file  Stress: Not on file  Social Connections: Not on file  Depression (PHQ2-9): Low Risk (11/19/2023)   Depression (PHQ2-9)    PHQ-2 Score: 0  Alcohol Screen: Not on file  Housing: Unknown (01/05/2024)   Received from [facility]   Epic    Unable to Pay for Housing in the Last Year: Not on file    Number of Times Moved in the Last Year: Not on file    At any time in the past 12 months, were you homeless or living in a shelter (including now)?: No  Utilities: Not on file  Health Literacy: Not on file    Family History  Problem Relation Age of Onset   Hypertension Mother  Diabetes Mother    Cancer Mother        colon   Diabetes Paternal Aunt    Hypertension Paternal Aunt     Outpatient Encounter Medications as of 04/12/2024  Medication Sig   Continuous Glucose Receiver (DEXCOM G7 RECEIVER) DEVI USE AS DIRECTED   Ferrous Sulfate (IRON ) 325 (65 Fe) MG TABS Take 1 tablet (325 mg total) by mouth 2 (two) times daily with a meal. (Patient taking differently: Take 325 mg by mouth daily.)   gabapentin  (NEURONTIN ) 300 MG capsule Take one capsule by mouth daily   hydrALAZINE  (APRESOLINE ) 25 MG tablet Take one tablet by mouth twice daily   labetalol  (NORMODYNE ) 200 MG tablet Take 2 tablets (400 mg total) by mouth 2 (two) times daily.   olmesartan -hydrochlorothiazide  (BENICAR  HCT) 40-25 MG tablet Take 1 tablet by mouth daily.   rosuvastatin  (CRESTOR ) 5 MG tablet Take one tablet by mouth daily   TRUEPLUS 5-BEVEL PEN NEEDLES 31G X 6 MM MISC Use with humalog  twice daily   Vitamin D , Ergocalciferol , (DRISDOL ) 1.25 MG (50000 UNIT) CAPS capsule Take 1 capsule (50,000 Units total) by mouth every 7 (seven) days.   [DISCONTINUED] Continuous Glucose Sensor (DEXCOM G7 SENSOR) MISC Inject 1 Application into the skin as directed. Change sensor every 10 days as directed.   [DISCONTINUED] HUMALOG  MIX 75/25 KWIKPEN  (75-25) 100 UNIT/ML KwikPen Inject 12 units into the skin 2 times daily with a meal (Patient taking differently: 15 Units in the morning and at bedtime.)   [DISCONTINUED] metFORMIN  (GLUCOPHAGE ) 500 MG tablet Take one tablet by mouth twice daily   Continuous Glucose Sensor (DEXCOM G7 SENSOR) MISC Inject 1 Application into the skin as directed. Change sensor every 10 days as directed.   Insulin  Lispro Prot & Lispro (HUMALOG  MIX 75/25 KWIKPEN) (75-25) 100 UNIT/ML Kwikpen Inject 15 Units into the skin in the morning and at bedtime.   metFORMIN  (GLUCOPHAGE ) 500 MG tablet Take 1 tablet (500 mg total) by mouth 2 (two) times daily.   [DISCONTINUED] PARoxetine  Mesylate 7.5 MG CAPS Take 1 tablet by mouth daily. (Patient not taking: Reported on 04/12/2024)   No facility-administered encounter medications on file as of 04/12/2024.    ALLERGIES: No Known Allergies  VACCINATION STATUS:  There is no immunization history on file for this patient.  Diabetes She presents for her follow-up diabetic visit. She has type 2 diabetes mellitus. Onset time: diagnosed at approx age of 89. Her disease course has been stable. There are no hypoglycemic associated symptoms. There are no hypoglycemic complications. 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 Risk factors for coronary artery disease include diabetes mellitus, dyslipidemia, family history, hypertension and sedentary lifestyle. Current diabetic treatment includes insulin  injections and oral agent (dual therapy). She is compliant with treatment most of the time. Her weight is fluctuating minimally. She is following a generally unhealthy diet. When asked about meal planning, she reported none. She has not had a previous visit with a dietitian. She rarely participates in exercise. Her home blood glucose trend is fluctuating minimally. Her overall blood glucose range is 110-130 mg/dl. (She presents today, with her CGM showing at target  glycemic profile overall.  Her POCT A1c today is 6.8%, increasing from last visit of 5.8%.  Analysis of her CGM shows TIR 91%, TAR 8%, TBR 1% with a GMI of 6.6%.  She notes she has been a bit more stressed recently.) An ACE inhibitor/angiotensin II receptor blocker is being taken. She does not see a podiatrist.Eye exam  is current.     Review of systems  Constitutional: + Minimally fluctuating body weight,  current Body mass index is 30.47 kg/m. , no fatigue, no subjective hyperthermia, no subjective hypothermia Eyes: + legally blind ENT: no sore throat, no nodules palpated in throat, no dysphagia/odynophagia, no hoarseness Cardiovascular: no chest pain, no shortness of breath, no palpitations, no leg swelling Respiratory: no cough, no shortness of breath Gastrointestinal: no nausea/vomiting/diarrhea Musculoskeletal: no muscle/joint aches Skin: no rashes, no hyperemia Neurological: no tremors, no numbness, no tingling, no dizziness Psychiatric: no depression, no anxiety  Objective:     BP 138/78 (BP Location: Right Arm, Patient Position: Sitting, Cuff Size: Large)   Pulse 100   Ht 5' 3 (1.6 m)   Wt 172 lb (78 kg)   BMI 30.47 kg/m   Wt Readings from Last 3 Encounters:  04/12/24 172 lb (78 kg)  12/10/23 170 lb 12.8 oz (77.5 kg)  11/19/23 174 lb (78.9 kg)     BP Readings from Last 3 Encounters:  04/12/24 138/78  12/22/23 (!) 177/98  12/10/23 (!) 142/90      Physical Exam- Limited  Constitutional:  Body mass index is 30.47 kg/m. , not in acute distress, normal state of mind Eyes:  EOMI, no exophthalmos Musculoskeletal: no gross deformities, strength intact in all four extremities, no gross restriction of joint movements Skin:  no rashes, no hyperemia Neurological: no tremor with outstretched hands   Diabetic Foot Exam - Simple   No data filed      CMP ( most recent) CMP     Component Value Date/Time   NA 141 11/19/2023 0916   K 3.9 11/19/2023 0916   CL 107  (H) 11/19/2023 0916   CO2 20 11/19/2023 0916   GLUCOSE 85 11/19/2023 0916   GLUCOSE 455 (H) 07/25/2020 0849   BUN 18 11/19/2023 0916   CREATININE 1.45 (H) 11/19/2023 0916   CREATININE 0.83 03/09/2019 1018   CALCIUM  9.3 11/19/2023 0916   PROT 6.7 11/19/2023 0916   ALBUMIN 4.2 11/19/2023 0916   AST 15 11/19/2023 0916   ALT 11 11/19/2023 0916   ALKPHOS 113 11/19/2023 0916   BILITOT <0.2 11/19/2023 0916   EGFR 46 (L) 11/19/2023 0916   GFRNONAA >60 07/25/2020 0849   GFRNONAA 89 03/09/2019 1018     Diabetic Labs (most recent): Lab Results  Component Value Date   HGBA1C 6.8 (A) 04/12/2024   HGBA1C 5.8 (H) 11/19/2023   HGBA1C 7.7 (H) 04/22/2023     Lipid Panel ( most recent) Lipid Panel     Component Value Date/Time   CHOL 119 11/19/2023 0916   TRIG 68 11/19/2023 0916   HDL 48 11/19/2023 0916   CHOLHDL 2.5 11/19/2023 0916   CHOLHDL 4.5 03/09/2019 1018   LDLCALC 57 11/19/2023 0916   LDLCALC 131 (H) 03/09/2019 1018   LABVLDL 14 11/19/2023 0916      Lab Results  Component Value Date   TSH 2.390 11/19/2023   TSH 1.730 04/22/2023   TSH 1.430 02/01/2020   TSH 1.65 03/09/2019   FREET4 1.27 11/19/2023   FREET4 1.39 04/22/2023           Assessment & Plan:   1) Type 2 diabetes mellitus with stage 3a chronic kidney disease, with long-term current use of insulin  (HCC) (Primary)  She presents today, with her CGM showing at target glycemic profile overall.  Her POCT A1c today is 6.8%, increasing from last visit of 5.8%.  Analysis of her CGM shows TIR  91%, TAR 8%, TBR 1% with a GMI of 6.6%.  She notes she has been a bit more stressed recently.  - LATORA QUARRY has currently uncontrolled symptomatic type 2 DM since 44 years of age.   -Recent labs reviewed.  - I had a long discussion with her about the progressive nature of diabetes and the pathology behind its complications. -her diabetes is complicated by CKD stage 3a , and retinopathy (causing blindness), and she  remains at a high risk for more acute and chronic complications which include CAD, CVA, CKD, retinopathy, and neuropathy. These are all discussed in detail with her.  The following Lifestyle Medicine recommendations according to American College of Lifestyle Medicine [facility]) were discussed and offered to patient and she agrees to start the journey:  A. Whole Foods, Plant-based plate comprising of fruits and vegetables, plant-based proteins, whole-grain carbohydrates was discussed in detail with the patient.   A list for source of those nutrients were also provided to the patient.  Patient will use only water or unsweetened tea for hydration. B.  The need to stay away from risky substances including alcohol, smoking; obtaining 7 to 9 hours of restorative sleep, at least 150 minutes of moderate intensity exercise weekly, the importance of healthy social connections,  and stress reduction techniques were discussed. C.  A full color page of  Calorie density of various food groups per pound showing examples of each food groups was provided to the patient.  - Nutritional counseling repeated/built upon at each appointment.  - The patient admits there is a room for improvement in their diet and drink choices. -  Suggestion is made for the patient to avoid simple carbohydrates from their diet including Cakes, Sweet Desserts / Pastries, Ice Cream, Soda (diet and regular), Sweet Tea, Candies, Chips, Cookies, Sweet Pastries, Store Bought Juices, Alcohol in Excess of 1-2 drinks a day, Artificial Sweeteners, Coffee Creamer, and Sugar-free Products. This will help patient to have stable blood glucose profile and potentially avoid unintended weight gain.   - I encouraged the patient to switch to unprocessed or minimally processed complex starch and increased protein intake (animal or plant source), fruits, and vegetables.   - Patient is advised to stick to a routine mealtimes to eat 3 meals a day and avoid  unnecessary snacks (to snack only to correct hypoglycemia).  - I have approached her with the following individualized plan to manage her diabetes and patient agrees:   -She is advised to continue Humalog  75/25 15 units SQ BID with breakfast and supper if glucose is above 90 and she is eating.  She can also continue Metformin  500 mg po twice daily.  -she is encouraged to start/continue monitoring glucose 4 times daily (using her CGM), before meals and before bed, and to call the clinic if she has readings less than 70 or above 300 for 3 tests in a row.  - she is warned not to take insulin  without proper monitoring per orders. - Adjustment parameters are given to her for hypo and hyperglycemia in writing.   she is not a candidate for full dose Metformin  due to concurrent renal insufficiency.  - she will be considered for incretin therapy as appropriate next visit.  - Specific targets for  A1c; LDL, HDL, and Triglycerides were discussed with the patient.  2) Blood Pressure /Hypertension:  her blood pressure is controlled to target.   she is advised to continue her current medications as prescribed by PCP.  3) Lipids/Hyperlipidemia:  Review of her recent lipid panel from 11/19/23 showed controlled LDL at 57.  she is advised to continue Crestor  5 mg daily at bedtime.  Side effects and precautions discussed with her.  4)  Weight/Diet:  her Body mass index is 30.47 kg/m.  -  clearly complicating her diabetes care.   she is a candidate for weight loss. I discussed with her the fact that loss of 5 - 10% of her  current body weight will have the most impact on her diabetes management.  Exercise, and detailed carbohydrates information provided  -  detailed on discharge instructions.  5) Chronic Care/Health Maintenance: -she is on ACEI/ARB and Statin medications and is encouraged to initiate and continue to follow up with Ophthalmology, Dentist, Podiatrist at least yearly or according to  recommendations, and advised to stay away from smoking. I have recommended yearly flu vaccine and pneumonia vaccine at least every 5 years; moderate intensity exercise for up to 150 minutes weekly; and sleep for at least 7 hours a day.  - she is advised to maintain close follow up with Bacchus, Meade PEDLAR, FNP for primary care needs, as well as her other providers for optimal and coordinated care.     I spent  45  minutes in the care of the patient today including review of labs from CMP, Lipids, Thyroid  Function, Hematology (current and previous including abstractions from other facilities); face-to-face time discussing  her blood glucose readings/logs, discussing hypoglycemia and hyperglycemia episodes and symptoms, medications doses, her options of short and long term treatment based on the latest standards of care / guidelines;  discussion about incorporating lifestyle medicine;  and documenting the encounter. Risk reduction counseling performed per USPSTF guidelines to reduce obesity and cardiovascular risk factors.     Please refer to Patient Instructions for Blood Glucose Monitoring and Insulin /Medications Dosing Guide  in media tab for additional information. Please  also refer to  Patient Self Inventory in the Media  tab for reviewed elements of pertinent patient history.  Arne JONELLE Marc participated in the discussions, expressed understanding, and voiced agreement with the above plans.  All questions were answered to her satisfaction. she is encouraged to contact clinic should she have any questions or concerns prior to her return visit.    Follow up plan: - Return in about 6 months (around 10/10/2024) for Diabetes F/U with A1c in office, No previsit labs, Bring meter and logs.   Benton Rio, [facility] [address] [place], [place] [address] Phone: [phone] Fax: [phone]  04/12/2024, 11:13 AM    "

## 2024-04-20 ENCOUNTER — Other Ambulatory Visit: Payer: Self-pay | Admitting: Family Medicine

## 2024-04-20 DIAGNOSIS — I10 Essential (primary) hypertension: Secondary | ICD-10-CM

## 2024-04-22 ENCOUNTER — Ambulatory Visit (INDEPENDENT_AMBULATORY_CARE_PROVIDER_SITE_OTHER)

## 2024-04-22 VITALS — BP 210/88 | HR 91 | Resp 12 | Ht 63.0 in | Wt 172.0 lb

## 2024-04-22 DIAGNOSIS — Z124 Encounter for screening for malignant neoplasm of cervix: Secondary | ICD-10-CM | POA: Diagnosis not present

## 2024-04-22 DIAGNOSIS — Z1231 Encounter for screening mammogram for malignant neoplasm of breast: Secondary | ICD-10-CM

## 2024-04-22 DIAGNOSIS — Z2821 Immunization not carried out because of patient refusal: Secondary | ICD-10-CM

## 2024-04-22 DIAGNOSIS — Z0000 Encounter for general adult medical examination without abnormal findings: Secondary | ICD-10-CM

## 2024-04-22 NOTE — Patient Instructions (Signed)
 Ms. Harms,  Thank you for taking the time for your Medicare Wellness Visit. I appreciate your continued commitment to your health goals. Please review the care plan we discussed, and feel free to reach out if I can assist you further.  Please note that Annual Wellness Visits do not include a physical exam. Some assessments may be limited, especially if the visit was conducted virtually. If needed, we may recommend an in-person follow-up with your provider.  Ongoing Care Seeing your primary care provider every 3 to 6 months helps us  monitor your health and provide consistent, personalized care.   Referrals If a referral was made during today's visit and you haven't received any updates within two weeks, please contact the referred provider directly to check on the status.  Orders Placed This Encounter  Procedures   MM 3D SCREENING MAMMOGRAM BILATERAL BREAST    Standing Status:   Future    Expiration Date:   04/22/2025    Reason for Exam (SYMPTOM  OR DIAGNOSIS REQUIRED):   breast cancer screening    Preferred imaging location?:   [facility]    Is the patient pregnant?:   No   Ambulatory referral to Obstetrics / Gynecology    Referral Priority:   Routine    Referral Type:   Consultation    Referral Reason:   Specialty Services Required    Referred to Provider:   Ozan, Jennifer, DO    Requested Specialty:   Obstetrics and Gynecology    Number of Visits Requested:   1   Recommended Screenings:  Health Maintenance  Topic Date Due   Medicare Annual Wellness Visit  Never done   Kidney health urinalysis for diabetes  Never done   DTaP/Tdap/Td vaccine (1 - Tdap) Never done   Pneumococcal Vaccine (1 of 2 - PCV) Never done   Hepatitis B Vaccine (1 of 3 - 19+ 3-dose series) Never done   Breast Cancer Screening  Never done   Flu Shot  Never done   COVID-19 Vaccine (1 - 2025-26 season) Never done   Pap with HPV screening  05/08/2024   Hemoglobin A1C  10/10/2024   Yearly kidney  function blood test for diabetes  11/18/2024   Complete foot exam   12/09/2024   Eye exam for diabetics  01/04/2025   HPV Vaccine (No Doses Required) Completed   Hepatitis C Screening  Completed   HIV Screening  Completed   Meningitis B Vaccine  Aged Out       04/22/2024   10:02 AM  Advanced Directives  Does Patient Have a Medical Advance Directive? No  Would patient like information on creating a medical advance directive? Yes (MAU/Ambulatory/Procedural Areas - Information given)    Vision: Annual vision screenings are recommended for early detection of glaucoma, cataracts, and diabetic retinopathy. These exams can also reveal signs of chronic conditions such as diabetes and high blood pressure.  Dental: Annual dental screenings help detect early signs of oral cancer, gum disease, and other conditions linked to overall health, including heart disease and diabetes.  Please see the attached documents for additional preventive care recommendations.

## 2024-04-22 NOTE — Progress Notes (Signed)
 "  Patient's blood pressure was elevated on initial reading and was rechecked manually after letting her sit for several minutes. On recheck it was still very elevated. Acute video visit scheduled with Meade for Monday Apr 25, 2024 at 8:00. Patient and caregiver aware and in agreement with treatment plan.   Routine follow up also scheduled during todays visit for April with Leita Longs  Chief Complaint  Patient presents with   Medicare Wellness     Subjective:   Dawn Benjamin is a 44 y.o. female who presents for a Medicare Annual Wellness Visit.  Visit info / Clinical Intake: Medicare Wellness Visit Type:: Subsequent Annual Wellness Visit; Initial Annual Wellness Visit Persons participating in visit and providing information:: patient & caregiver Medicare Wellness Visit Mode:: In-person (required for WTM) Interpreter Needed?: No Pre-visit prep was completed: yes AWV questionnaire completed by patient prior to visit?: no Living arrangements:: with family/others Patient's Overall Health Status Rating: good Typical amount of pain: none Does pain affect daily life?: no Are you currently prescribed opioids?: no  Dietary Habits and Nutritional Risks How many meals a day?: 3 Eats fruit and vegetables daily?: yes Most meals are obtained by: having others provide food In the last 2 weeks, have you had any of the following?: none Diabetic:: (!) yes Any non-healing wounds?: no How often do you check your BS?: 2; 3; continuous glucose monitor (patient wears a continous monitor but also checks bg between 2-3 times daily for accuracy) Would you like to be referred to a Nutritionist or for Diabetic Management? : no  Functional Status Activities of Daily Living (to include ambulation/medication): (!) Needs Assist Feeding: Independent Dressing/Grooming: Needs assistance Bathing: Needs assistance Toileting: Independent Transfer: Needs assistance Ambulation: Independent with device-  listed below Home Assistive Devices/Equipment: Walker (specify Type); Other (Comment) Medication Administration: Needs assistance (comment) Is this a change from baseline?: Pre-admission baseline Home Management (perform basic housework or laundry): Needs assistance (comment) Manage your own finances?: (!) no Primary transportation is: family / friends Concerns about vision?: no *vision screening is required for WTM* (patient is legally blind. up to date with exams) Concerns about hearing?: no  Fall Screening Falls in the past year?: 0 Number of falls in past year: 0 Was there an injury with Fall?: 0 Fall Risk Category Calculator: 0 Patient Fall Risk Level: Low Fall Risk  Fall Risk Patient at Risk for Falls Due to: Impaired vision Fall risk Follow up: Falls evaluation completed; Education provided; Falls prevention discussed  Home and Transportation Safety: All rugs have non-skid backing?: N/A, no rugs All stairs or steps have railings?: N/A, no stairs Grab bars in the bathtub or shower?: yes Have non-skid surface in bathtub or shower?: yes Good home lighting?: yes Regular seat belt use?: yes Hospital stays in the last year:: no  Cognitive Assessment Difficulty concentrating, remembering, or making decisions? : no Will 6CIT or Mini Cog be Completed: yes What year is it?: 0 points What month is it?: 0 points Give patient an address phrase to remember (5 components): [address] [place] About what time is it?: 0 points Count backwards from 20 to 1: 0 points Say the months of the year in reverse: 0 points Repeat the address phrase from earlier: 0 points 6 CIT Score: 0 points  Advance Directives (For Healthcare) Does Patient Have a Medical Advance Directive?: No Would patient like information on creating a medical advance directive?: Yes (MAU/Ambulatory/Procedural Areas - Information given)  Reviewed/Updated  Reviewed/Updated: Reviewed All (Medical,  Surgical, Family,  Medications, Allergies, Care Teams, Patient Goals)    Allergies (verified) Patient has no known allergies.   Current Medications (verified) Outpatient Encounter Medications as of 04/22/2024  Medication Sig   Continuous Glucose Receiver (DEXCOM G7 RECEIVER) DEVI USE AS DIRECTED   Continuous Glucose Sensor (DEXCOM G7 SENSOR) MISC Inject 1 Application into the skin as directed. Change sensor every 10 days as directed.   Ferrous Sulfate (IRON ) 325 (65 Fe) MG TABS Take 1 tablet (325 mg total) by mouth 2 (two) times daily with a meal. (Patient taking differently: Take 325 mg by mouth daily.)   gabapentin  (NEURONTIN ) 300 MG capsule Take one capsule by mouth daily   hydrALAZINE  (APRESOLINE ) 25 MG tablet Take one tablet by mouth twice daily   Insulin  Lispro Prot & Lispro (HUMALOG  MIX 75/25 KWIKPEN) (75-25) 100 UNIT/ML Kwikpen Inject 15 Units into the skin in the morning and at bedtime.   labetalol  (NORMODYNE ) 200 MG tablet Take 2 tablets by mouth twice daily   metFORMIN  (GLUCOPHAGE ) 500 MG tablet Take 1 tablet (500 mg total) by mouth 2 (two) times daily.   olmesartan -hydrochlorothiazide  (BENICAR  HCT) 40-25 MG tablet Take 1 tablet by mouth daily.   rosuvastatin  (CRESTOR ) 5 MG tablet Take one tablet by mouth daily   TRUEPLUS 5-BEVEL PEN NEEDLES 31G X 6 MM MISC Use with humalog  twice daily   Vitamin D , Ergocalciferol , (DRISDOL ) 1.25 MG (50000 UNIT) CAPS capsule Take 1 capsule (50,000 Units total) by mouth every 7 (seven) days.   No facility-administered encounter medications on file as of 04/22/2024.    History: Past Medical History:  Diagnosis Date   Allergy    Anxiety    Diabetes mellitus without complication (HCC)    Hypertension    Menorrhagia 05/09/2019   Regular cycles   Past Surgical History:  Procedure Laterality Date   CHOLECYSTECTOMY     DENTAL SURGERY     Family History  Problem Relation Age of Onset   Hypertension Mother    Diabetes Mother    Cancer Mother        colon    Diabetes Paternal Aunt    Hypertension Paternal Aunt    Social History   Occupational History   Occupation: CNA     Comment: [employer]  Tobacco Use   Smoking status: Some Days    Current packs/day: 0.10    Types: Cigarettes   Smokeless tobacco: Never  Vaping Use   Vaping status: Never Used  Substance and Sexual Activity   Alcohol use: Yes   Drug use: No   Sexual activity: Yes    Birth control/protection: Condom   Tobacco Counseling Ready to quit: No Counseling given: Yes  SDOH Screenings   Food Insecurity: No Food Insecurity (04/22/2024)  Housing: Low Risk (04/22/2024)  Transportation Needs: No Transportation Needs (04/22/2024)  Utilities: Not At Risk (04/22/2024)  Depression (PHQ2-9): Low Risk (04/22/2024)  Physical Activity: Patient Declined (04/22/2024)  Social Connections: Socially Isolated (04/22/2024)  Stress: No Stress Concern Present (04/22/2024)  Tobacco Use: High Risk (04/22/2024)  Health Literacy: Adequate Health Literacy (04/22/2024)   See flowsheets for full screening details  Depression Screen PHQ 2 & 9 Depression Scale- Over the past 2 weeks, how often have you been bothered by any of the following problems? Little interest or pleasure in doing things: 0 Feeling down, depressed, or hopeless (PHQ Adolescent also includes...irritable): 0 PHQ-2 Total Score: 0 Trouble falling or staying asleep, or sleeping too much: 0 Feeling tired or having little energy:  0 Poor appetite or overeating (PHQ Adolescent also includes...weight loss): 0 Feeling bad about yourself - or that you are a failure or have let yourself or your family down: 0 Trouble concentrating on things, such as reading the newspaper or watching television (PHQ Adolescent also includes...like school work): 0 Moving or speaking so slowly that other people could have noticed. Or the opposite - being so fidgety or restless that you have been moving around a lot more than usual: 0 Thoughts that you would be  better off dead, or of hurting yourself in some way: 0 PHQ-9 Total Score: 0 If you checked off any problems, how difficult have these problems made it for you to do your work, take care of things at home, or get along with other people?: Not difficult at all  Depression Treatment Depression Interventions/Treatment : EYV7-0 Score <4 Follow-up Not Indicated     Goals Addressed             This Visit's Progress    Remain active and healthy               Objective:    Today's Vitals   04/22/24 0959 04/22/24 1000  BP: (!) 185/11 (!) 210/88  Pulse: 91   Resp: 12   SpO2: 98%   Weight: 172 lb 0.6 oz (78 kg)   Height: 5' 3 (1.6 m)    Body mass index is 30.48 kg/m.  Hearing/Vision screen Hearing Screening - Comments:: Patient denies any hearing difficulties.   Vision Screening - Comments:: Patient is legally blind. Sees Cristina Skillern at [facility] and Health Maintenance Health Maintenance  Topic Date Due   Medicare Annual Wellness (AWV)  Never done   Diabetic kidney evaluation - Urine ACR  Never done   DTaP/Tdap/Td (1 - Tdap) Never done   Pneumococcal Vaccine (1 of 2 - PCV) Never done   Hepatitis B Vaccines 19-59 Average Risk (1 of 3 - 19+ 3-dose series) Never done   Mammogram  Never done   Influenza Vaccine  Never done   COVID-19 Vaccine (1 - 2025-26 season) Never done   Cervical Cancer Screening (HPV/Pap Cotest)  05/08/2024   HEMOGLOBIN A1C  10/10/2024   Diabetic kidney evaluation - eGFR measurement  11/18/2024   FOOT EXAM  12/09/2024   OPHTHALMOLOGY EXAM  01/04/2025   HPV VACCINES (No Doses Required) Completed   Hepatitis C Screening  Completed   HIV Screening  Completed   Meningococcal B Vaccine  Aged Out        Assessment/Plan:  This is a routine wellness examination for Dawn Benjamin.  Patient Care Team: Bacchus, Meade PEDLAR, FNP as PCP - General (Family Medicine) Therisa Benton PARAS, NP as Nurse Practitioner (Endocrinology) Skillern Pinna, MD as  Referring Physician (Ophthalmology)  I have personally reviewed and noted the following in the patients chart:   Medical and social history Use of alcohol, tobacco or illicit drugs  Current medications and supplements including opioid prescriptions. Functional ability and status Nutritional status Physical activity Advanced directives List of other physicians Hospitalizations, surgeries, and ER visits in previous 12 months Vitals Screenings to include cognitive, depression, and falls Referrals and appointments  Orders Placed This Encounter  Procedures   MM 3D SCREENING MAMMOGRAM BILATERAL BREAST    Standing Status:   Future    Expiration Date:   04/22/2025    Reason for Exam (SYMPTOM  OR DIAGNOSIS REQUIRED):   breast cancer screening    Preferred imaging location?:   [facility]    Is the patient pregnant?:   No   Ambulatory referral to Obstetrics / Gynecology    Referral Priority:   Routine    Referral Type:   Consultation    Referral Reason:   Specialty Services Required    Referred to Provider:   Ozan, Jennifer, DO    Requested Specialty:   Obstetrics and Gynecology    Number of Visits Requested:   1   In addition, I have reviewed and discussed with patient certain preventive protocols, quality metrics, and best practice recommendations. A written personalized care plan for preventive services as well as general preventive health recommendations were provided to patient.   Ivadell Gaul, CMA   04/22/2024   Return April 24, 2025 at 10:40 am, for In office Medicare Well Visit w  Wellness Nurse.  After Visit Summary: (MyChart) Due to this being a telephonic visit, the after visit summary with patients personalized plan was offered to patient via MyChart    "

## 2024-04-25 ENCOUNTER — Ambulatory Visit: Payer: Self-pay | Admitting: Family Medicine

## 2024-04-28 ENCOUNTER — Telehealth: Admitting: Family Medicine

## 2024-04-28 DIAGNOSIS — I10 Essential (primary) hypertension: Secondary | ICD-10-CM | POA: Diagnosis not present

## 2024-04-28 DIAGNOSIS — E038 Other specified hypothyroidism: Secondary | ICD-10-CM

## 2024-04-28 DIAGNOSIS — R7301 Impaired fasting glucose: Secondary | ICD-10-CM

## 2024-04-28 DIAGNOSIS — E7849 Other hyperlipidemia: Secondary | ICD-10-CM

## 2024-04-28 DIAGNOSIS — E559 Vitamin D deficiency, unspecified: Secondary | ICD-10-CM

## 2024-04-28 NOTE — Progress Notes (Signed)
 "  Virtual Visit via Video Note  I connected with Dawn Benjamin on 04/28/24 at  8:00 AM EST by a video enabled telemedicine application and verified that I am speaking with the correct person using two identifiers.  Patient Location: Home Provider Location: Home Office  I discussed the limitations, risks, security, and privacy concerns of performing an evaluation and management service by video and the availability of in person appointments. I also discussed with the patient that there may be a patient responsible charge related to this service. The patient expressed understanding and agreed to proceed.  Subjective: PCP: Edman Meade PEDLAR, FNP  Chief Complaint  Patient presents with   Hypertension   HPI The patient is following up today regarding elevated blood pressure readings noted at her annual medical visit. She reports that the elevated reading occurred that morning before she had taken her medication and during a rushed morning. She states that her blood pressure is usually not that elevated. Since the annual visit, her blood pressure has been well controlled, and she has remained asymptomatic. She denies headaches, chest pain, or shortness of breath.   ROS: Per HPI Current Medications[1]  Observations/Objective: There were no vitals filed for this visit. Physical Exam Patient is well-developed, well-nourished in no acute distress.  Resting comfortably at home.  Head is normocephalic, atraumatic.  No labored breathing.  Speech is clear and coherent with logical content.  Patient is alert and oriented at baseline.   Assessment and Plan: Primary hypertension Assessment & Plan: Continue current blood pressure medication regimen as prescribed. Encourage adherence to daily medications and home blood pressure monitoring. Advise the patient to take blood pressure medication prior to future appointments when possible. Reinforce lifestyle modifications, including a  low-sodium diet, regular physical activity, stress management, and adequate hydration. Educate the patient on signs and symptoms that warrant prompt medical attention, including persistent elevated blood pressure, headaches, chest pain, dizziness, or shortness of breath. Follow up as scheduled or sooner if blood pressure readings become consistently elevated    IFG (impaired fasting glucose) -     Hemoglobin A1c  Vitamin D  deficiency -     VITAMIN D  25 Hydroxy (Vit-D Deficiency, Fractures)  TSH (thyroid -stimulating hormone deficiency) -     TSH + free T4  Other hyperlipidemia -     Lipid panel -     CMP14+EGFR -     CBC with Differential/Platelet  Note: This chart has been completed using [employer] (consulting) software, and while attempts have been made to ensure accuracy, certain words and phrases may not be transcribed as intended.    Follow Up Instructions: No follow-ups on file.   I discussed the assessment and treatment plan with the patient. The patient was provided an opportunity to ask questions, and all were answered. The patient agreed with the plan and demonstrated an understanding of the instructions.   The patient was advised to call back or seek an in-person evaluation if the symptoms worsen or if the condition fails to improve as anticipated.  The above assessment and management plan was discussed with the patient. The patient verbalized understanding of and has agreed to the management plan.   Dawn Benjamin  Z Bacchus, FNP     [1]  Current Outpatient Medications:    Continuous Glucose Receiver (DEXCOM G7 RECEIVER) DEVI, USE AS DIRECTED, Disp: 1 each, Rfl: 0   Continuous Glucose Sensor (DEXCOM G7 SENSOR) MISC, Inject 1 Application into the skin as directed. Change sensor every 10  days as directed., Disp: 9 each, Rfl: 3   Ferrous Sulfate (IRON ) 325 (65 Fe) MG TABS, Take 1 tablet (325 mg total) by mouth 2 (two) times daily with a meal. (Patient taking differently: Take  325 mg by mouth daily.), Disp: 60 tablet, Rfl: 3   gabapentin  (NEURONTIN ) 300 MG capsule, Take one capsule by mouth daily, Disp: 30 capsule, Rfl: 11   hydrALAZINE  (APRESOLINE ) 25 MG tablet, Take one tablet by mouth twice daily, Disp: 60 tablet, Rfl: 11   Insulin  Lispro Prot & Lispro (HUMALOG  MIX 75/25 KWIKPEN) (75-25) 100 UNIT/ML Kwikpen, Inject 15 Units into the skin in the morning and at bedtime., Disp: 27 mL, Rfl: 3   labetalol  (NORMODYNE ) 200 MG tablet, Take 2 tablets by mouth twice daily, Disp: 120 tablet, Rfl: 11   metFORMIN  (GLUCOPHAGE ) 500 MG tablet, Take 1 tablet (500 mg total) by mouth 2 (two) times daily., Disp: 180 tablet, Rfl: 0   olmesartan -hydrochlorothiazide  (BENICAR  HCT) 40-25 MG tablet, Take 1 tablet by mouth daily., Disp: 90 tablet, Rfl: 2   rosuvastatin  (CRESTOR ) 5 MG tablet, Take one tablet by mouth daily, Disp: 30 tablet, Rfl: 11   TRUEPLUS 5-BEVEL PEN NEEDLES 31G X 6 MM MISC, Use with humalog  twice daily, Disp: 100 each, Rfl: 11   Vitamin D , Ergocalciferol , (DRISDOL ) 1.25 MG (50000 UNIT) CAPS capsule, Take 1 capsule (50,000 Units total) by mouth every 7 (seven) days., Disp: 20 capsule, Rfl: 1  "

## 2024-04-28 NOTE — Assessment & Plan Note (Addendum)
 Continue current blood pressure medication regimen as prescribed. Encourage adherence to daily medications and home blood pressure monitoring. Advise the patient to take blood pressure medication prior to future appointments when possible. Reinforce lifestyle modifications, including a low-sodium diet, regular physical activity, stress management, and adequate hydration. Educate the patient on signs and symptoms that warrant prompt medical attention, including persistent elevated blood pressure, headaches, chest pain, dizziness, or shortness of breath. Follow up as scheduled or sooner if blood pressure readings become consistently elevated

## 2024-05-04 ENCOUNTER — Ambulatory Visit: Admitting: Adult Health

## 2024-05-04 ENCOUNTER — Encounter: Payer: Self-pay | Admitting: Adult Health

## 2024-05-04 VITALS — BP 160/86 | HR 88 | Ht 63.0 in | Wt 169.5 lb

## 2024-05-04 DIAGNOSIS — D509 Iron deficiency anemia, unspecified: Secondary | ICD-10-CM | POA: Insufficient documentation

## 2024-05-04 DIAGNOSIS — E1165 Type 2 diabetes mellitus with hyperglycemia: Secondary | ICD-10-CM

## 2024-05-04 DIAGNOSIS — N920 Excessive and frequent menstruation with regular cycle: Secondary | ICD-10-CM | POA: Insufficient documentation

## 2024-05-04 DIAGNOSIS — Z794 Long term (current) use of insulin: Secondary | ICD-10-CM

## 2024-05-04 DIAGNOSIS — Z01419 Encounter for gynecological examination (general) (routine) without abnormal findings: Secondary | ICD-10-CM

## 2024-05-04 DIAGNOSIS — Z0000 Encounter for general adult medical examination without abnormal findings: Secondary | ICD-10-CM | POA: Insufficient documentation

## 2024-05-04 DIAGNOSIS — I10 Essential (primary) hypertension: Secondary | ICD-10-CM

## 2024-05-04 DIAGNOSIS — E113523 Type 2 diabetes mellitus with proliferative diabetic retinopathy with traction retinal detachment involving the macula, bilateral: Secondary | ICD-10-CM

## 2024-05-04 MED ORDER — NORETHINDRONE 0.35 MG PO TABS: 1.0000 | ORAL_TABLET | Freq: Every day | ORAL | 2 refills | Status: AC

## 2024-05-04 NOTE — Progress Notes (Signed)
 Patient ID: Dawn Benjamin, female   DOB: May 27, 1980, 44 y.o.   MRN: [id number] History of Present Illness: Dawn Benjamin is a 44 year old black female single, G1P1001, in for a well woman gyn exam and pap. She is complaining of heavy periods, last about 7-8 days with 3-4 heavy, will use 3 pads and changes every 2.5-3 hours. Wants to discuss birth control.  Has assistant with her.   PCP is Meade Gerlach FNP    Current Medications, Allergies, Past Medical History, Past Surgical History, Family History and Social History were reviewed in [employer] record.     Review of Systems: Patient denies any headaches, hearing loss, fatigue, shortness of breath, chest pain, abdominal pain, problems with bowel movements, urination, or intercourse(no sex in months). No joint pain or mood swings.  Has  vision changes,due to diabetes,  can see shadows See HPI for positives   Physical Exam:BP (!) 160/86 (BP Location: Left Arm, Patient Position: Sitting, Cuff Size: Normal)   Pulse 88   Ht 5' 3 (1.6 m)   Wt 169 lb 8 oz (76.9 kg)   LMP 04/24/2024 (Approximate)   BMI 30.03 kg/m   General:  Well developed, well nourished, no acute distress Skin:  Warm and dry Neck:  Midline trachea, normal thyroid , good ROM, no lymphadenopathy Lungs; Clear to auscultation bilaterally Breast:  No dominant palpable mass, retraction, or nipple discharge Cardiovascular: Regular rate and rhythm Abdomen:  Soft, non tender, no hepatosplenomegaly Pelvic:  External genitalia is normal in appearance, no lesions.  The vagina is normal in appearance. Urethra has no lesions or masses. The cervix is bulbous. Pap with HR HPV genotyping performed. Uterus is felt to be normal size, shape, and contour.  No adnexal masses or tenderness noted.Bladder is non tender, no masses felt. Rectal: Deferred  Extremities/musculoskeletal:  No swelling or varicosities noted, no clubbing or cyanosis Psych:  No mood changes, alert  and cooperative,seems happy AA is 1 Fall risk is low    05/04/2024    9:24 AM 04/22/2024   10:05 AM 11/19/2023    8:36 AM  Depression screen PHQ 2/9  Decreased Interest 0 0 0  Down, Depressed, Hopeless 0 0 0  PHQ - 2 Score 0 0 0  Altered sleeping 0 0 0  Tired, decreased energy 0 0 0  Change in appetite 0 0 0  Feeling bad or failure about yourself  0 0 0  Trouble concentrating 0 0 0  Moving slowly or fidgety/restless 0 0 0  Suicidal thoughts 0 0 0  PHQ-9 Score 0 0 0   Difficult doing work/chores   Not difficult at all     Data saved with a previous flowsheet row definition       05/04/2024    9:24 AM 11/19/2023    8:36 AM 07/16/2023    8:22 AM 01/26/2020    9:00 AM  GAD 7 : Generalized Anxiety Score  Nervous, Anxious, on Edge 0 0  0  0   Control/stop worrying 0 0  0  0   Worry too much - different things 0 0  0  0   Trouble relaxing 0 0  0  0   Restless 0 0  0  0   Easily annoyed or irritable 0 0  0  1   Afraid - awful might happen 0 0  0  0   Total GAD 7 Score 0 0 0 1  Anxiety Difficulty  Not difficult at  all Not difficult at all Not difficult at all     Data saved with a previous flowsheet row definition        Upstream - 05/04/24 0905       Pregnancy Intention Screening   Does the patient want to become pregnant in the next year? No    Does the patient's partner want to become pregnant in the next year? No    Would the patient like to discuss contraceptive options today? Yes      Contraception Wrap Up   Current Method No Method - Other Reason    End Method Oral Contraceptive    Contraception Counseling Provided Yes    How was the end contraceptive method provided? Prescription         Examination chaperoned by Clarita Salt LPN   Impression and plan: 1. Routine general medical examination at a health care facility (Primary) Pap sent Pap in 3-5 years if normal Physical in 1 year Call [facility] to  get mammogram scheduled, the order was in by PCP -  Cytology - PAP( )  2. Menorrhagia with regular cycle Periods heavy Will get US  in office to assess uterus and ovaries, about 06/02/24 and see me after  - US  PELVIC COMPLETE WITH TRANSVAGINAL; Future Discussed POP, Depo, IUD and endometrial ablation, she wants to try POP first  Will rx micronor , can start now to see if helps with periods  Meds ordered this encounter  Medications   norethindrone  (MICRONOR ) 0.35 MG tablet    Sig: Take 1 tablet (0.35 mg total) by mouth daily.    Dispense:  28 tablet    Refill:  2    Supervising Provider:   JAYNE, LUTHER H [[id number]]    3. Primary hypertension Continue BP meds and follow up with PCP  4. 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 Iron  deficiency anemia, unspecified iron  deficiency anemia type Is taking OTC iron   6. Type 2 diabetes mellitus with hyperglycemia, with long-term current use of insulin  (HCC)

## 2024-05-05 LAB — CYTOLOGY - PAP
Chlamydia: NEGATIVE
Comment: NEGATIVE
Comment: NEGATIVE
Comment: NORMAL
Diagnosis: NEGATIVE
High risk HPV: NEGATIVE
Neisseria Gonorrhea: NEGATIVE

## 2024-05-06 ENCOUNTER — Ambulatory Visit: Payer: Self-pay | Admitting: Adult Health

## 2024-05-06 MED ORDER — METRONIDAZOLE 500 MG PO TABS: 500.0000 mg | ORAL_TABLET | Freq: Two times a day (BID) | ORAL | 0 refills | Status: AC

## 2024-06-02 ENCOUNTER — Other Ambulatory Visit

## 2024-06-02 ENCOUNTER — Ambulatory Visit: Admitting: Adult Health

## 2024-07-05 ENCOUNTER — Ambulatory Visit: Payer: Self-pay

## 2024-09-27 ENCOUNTER — Ambulatory Visit: Payer: Self-pay

## 2024-10-10 ENCOUNTER — Ambulatory Visit: Admitting: Nurse Practitioner

## 2025-04-24 ENCOUNTER — Ambulatory Visit: Payer: Self-pay
